# Patient Record
Sex: Female | Born: 1970 | Race: Black or African American | Hispanic: No | Marital: Married | Smoking: Current every day smoker
Health system: Southern US, Community
[De-identification: ages and names within clinical notes are randomized; demographics above are authoritative.]

## PROBLEM LIST (undated history)

## (undated) DIAGNOSIS — M5030 Other cervical disc degeneration, unspecified cervical region: Secondary | ICD-10-CM

## (undated) DIAGNOSIS — G47419 Narcolepsy without cataplexy: Secondary | ICD-10-CM

## (undated) DIAGNOSIS — M5430 Sciatica, unspecified side: Secondary | ICD-10-CM

## (undated) DIAGNOSIS — M549 Dorsalgia, unspecified: Secondary | ICD-10-CM

## (undated) DIAGNOSIS — G8929 Other chronic pain: Secondary | ICD-10-CM

## (undated) DIAGNOSIS — N83209 Unspecified ovarian cyst, unspecified side: Secondary | ICD-10-CM

## (undated) HISTORY — PX: OTHER SURGICAL HISTORY: SHX169

## (undated) HISTORY — PX: KNEE SURGERY: SHX244

## (undated) HISTORY — PX: ABDOMINAL HYSTERECTOMY: SHX81

---

## 1998-12-10 ENCOUNTER — Ambulatory Visit: Admission: RE | Admit: 1998-12-10 | Discharge: 1998-12-10 | Payer: Self-pay

## 1998-12-18 ENCOUNTER — Ambulatory Visit: Admission: RE | Admit: 1998-12-18 | Discharge: 1998-12-18 | Payer: Self-pay

## 2000-07-26 ENCOUNTER — Inpatient Hospital Stay (HOSPITAL_COMMUNITY): Admission: EM | Admit: 2000-07-26 | Discharge: 2000-07-28 | Payer: Self-pay | Admitting: *Deleted

## 2001-03-12 ENCOUNTER — Emergency Department (HOSPITAL_COMMUNITY): Admission: EM | Admit: 2001-03-12 | Discharge: 2001-03-12 | Payer: Self-pay | Admitting: Emergency Medicine

## 2001-03-21 ENCOUNTER — Encounter: Payer: Self-pay | Admitting: Internal Medicine

## 2001-03-21 ENCOUNTER — Ambulatory Visit (HOSPITAL_COMMUNITY): Admission: RE | Admit: 2001-03-21 | Discharge: 2001-03-21 | Payer: Self-pay | Admitting: Internal Medicine

## 2001-08-14 ENCOUNTER — Other Ambulatory Visit: Admission: RE | Admit: 2001-08-14 | Discharge: 2001-08-14 | Payer: Self-pay | Admitting: Obstetrics and Gynecology

## 2001-11-25 ENCOUNTER — Emergency Department (HOSPITAL_COMMUNITY): Admission: EM | Admit: 2001-11-25 | Discharge: 2001-11-25 | Payer: Self-pay | Admitting: *Deleted

## 2001-12-14 ENCOUNTER — Emergency Department (HOSPITAL_COMMUNITY): Admission: EM | Admit: 2001-12-14 | Discharge: 2001-12-14 | Payer: Self-pay | Admitting: Emergency Medicine

## 2001-12-14 ENCOUNTER — Encounter: Payer: Self-pay | Admitting: Emergency Medicine

## 2003-08-16 ENCOUNTER — Emergency Department (HOSPITAL_COMMUNITY): Admission: EM | Admit: 2003-08-16 | Discharge: 2003-08-16 | Payer: Self-pay | Admitting: Emergency Medicine

## 2003-08-20 ENCOUNTER — Inpatient Hospital Stay (HOSPITAL_COMMUNITY): Admission: RE | Admit: 2003-08-20 | Discharge: 2003-08-22 | Payer: Self-pay | Admitting: Obstetrics & Gynecology

## 2004-11-15 ENCOUNTER — Emergency Department (HOSPITAL_COMMUNITY): Admission: EM | Admit: 2004-11-15 | Discharge: 2004-11-15 | Payer: Self-pay | Admitting: Emergency Medicine

## 2004-11-19 ENCOUNTER — Ambulatory Visit: Payer: Self-pay | Admitting: Orthopedic Surgery

## 2004-11-24 ENCOUNTER — Ambulatory Visit (HOSPITAL_COMMUNITY): Admission: RE | Admit: 2004-11-24 | Discharge: 2004-11-24 | Payer: Self-pay | Admitting: Orthopedic Surgery

## 2004-11-30 ENCOUNTER — Ambulatory Visit: Payer: Self-pay | Admitting: Orthopedic Surgery

## 2004-12-08 ENCOUNTER — Ambulatory Visit (HOSPITAL_COMMUNITY): Admission: RE | Admit: 2004-12-08 | Discharge: 2004-12-08 | Payer: Self-pay | Admitting: Orthopedic Surgery

## 2004-12-08 ENCOUNTER — Ambulatory Visit: Payer: Self-pay | Admitting: Orthopedic Surgery

## 2004-12-10 ENCOUNTER — Ambulatory Visit: Payer: Self-pay | Admitting: Orthopedic Surgery

## 2004-12-10 ENCOUNTER — Encounter (HOSPITAL_COMMUNITY): Admission: RE | Admit: 2004-12-10 | Discharge: 2005-01-09 | Payer: Self-pay | Admitting: Orthopedic Surgery

## 2005-01-18 ENCOUNTER — Encounter (HOSPITAL_COMMUNITY): Admission: RE | Admit: 2005-01-18 | Discharge: 2005-02-17 | Payer: Self-pay | Admitting: Orthopedic Surgery

## 2005-01-20 ENCOUNTER — Ambulatory Visit: Payer: Self-pay | Admitting: Orthopedic Surgery

## 2005-02-22 ENCOUNTER — Ambulatory Visit: Payer: Self-pay | Admitting: Orthopedic Surgery

## 2006-04-08 ENCOUNTER — Emergency Department (HOSPITAL_COMMUNITY): Admission: EM | Admit: 2006-04-08 | Discharge: 2006-04-08 | Payer: Self-pay | Admitting: Emergency Medicine

## 2006-07-28 ENCOUNTER — Emergency Department (HOSPITAL_COMMUNITY): Admission: EM | Admit: 2006-07-28 | Discharge: 2006-07-28 | Payer: Self-pay | Admitting: Emergency Medicine

## 2006-08-01 ENCOUNTER — Ambulatory Visit: Payer: Self-pay | Admitting: Orthopedic Surgery

## 2006-08-29 ENCOUNTER — Ambulatory Visit: Payer: Self-pay | Admitting: Orthopedic Surgery

## 2007-02-12 ENCOUNTER — Emergency Department (HOSPITAL_COMMUNITY): Admission: EM | Admit: 2007-02-12 | Discharge: 2007-02-12 | Payer: Self-pay | Admitting: Emergency Medicine

## 2007-05-26 ENCOUNTER — Emergency Department (HOSPITAL_COMMUNITY): Admission: EM | Admit: 2007-05-26 | Discharge: 2007-05-26 | Payer: Self-pay | Admitting: Emergency Medicine

## 2008-01-10 ENCOUNTER — Emergency Department (HOSPITAL_COMMUNITY): Admission: EM | Admit: 2008-01-10 | Discharge: 2008-01-10 | Payer: Self-pay | Admitting: Emergency Medicine

## 2008-03-10 ENCOUNTER — Emergency Department (HOSPITAL_COMMUNITY): Admission: EM | Admit: 2008-03-10 | Discharge: 2008-03-10 | Payer: Self-pay | Admitting: Emergency Medicine

## 2011-02-19 NOTE — Op Note (Signed)
NAME:  Mary Holland, Mary Holland                       ACCOUNT NO.:  [id number]   MEDICAL RECORD NO.:  [id number]                   PATIENT TYPE:  AMB   LOCATION:  DAY                                  FACILITY:  [facility]   PHYSICIAN:  Lazaro Arms, M.D.                DATE OF BIRTH:  [birth year]   DATE OF PROCEDURE:  08/20/2003  DATE OF DISCHARGE:                                 OPERATIVE REPORT   PREOPERATIVE DIAGNOSES:  1. Dysmenorrhea.  2. Menometrorrhagia.  3. Left lower quadrant pain.  4. Dyspareunia.   POSTOPERATIVE DIAGNOSES:  1. Dysmenorrhea.  2. Menometrorrhagia.  3. Left lower quadrant pain.  4. Dyspareunia.   PROCEDURE:  1. Total abdominal hysterectomy.  2. Bilateral salpingo-oophorectomy.   SURGEON:  Lazaro Arms, M.D.   ANESTHESIA:  General endotracheal anesthesia.   FINDINGS:  The patient had normal size uterus.  Left ovary had a hemorrhagic  cyst on it.  The right ovary was normal.   DESCRIPTION OF OPERATION:  The patient was taken to the operating room and  placed in the supine position where she underwent general endotracheal  anesthesia. After being placed in the supine position, she was then prepped  and draped in the usual sterile fashion.  The vagina was prepped.  A  Pfannenstiel incision was made and carried down sharply to the rectus fascia  which was scored in the midline and extended laterally.  The fascia was  taken off the muscles superiorly and inferiorly without difficulty.  The  muscles were divided and the peritoneal cavity was entered.  Balfour self-  retaining retractor was placed.  The bladder blade was placed.   The uterine cornu were grasped.  The found ligament was sutured ligated and  cut.  The vesicouterine serosal flap on the left on the left was created.  The infundibulopelvic ligament on the left was isolated.  It was clamped,  cut and double suture ligated.  The uterine vessels were skeletonized.  The  bladder was pushed off the lower  uterine segment.  The uterine vessels were  clamped, cut, and suture ligated.  The right round ligament was suture  ligated and cut.  The utero-ovarian ligament was cross-clamped, sparing the  ovary.  It was double suture ligated.  The uterine vessels were  skeletonized.  The vesicouterine serosal flap was created.  The uterine  vessels were clamped, cut, and suture ligated.  Serial pedicles were taken  down the cervix to the cardinal ligament; each pedicle being clamped, cut,  and sutured ligated.  The vagina was then cross clamped.  The specimen was  removed; and the vaginal was closed with vaginal angle sutures, interrupted  figure-of-eight suture.   The pelvis was irrigated vigorously.  There was good hemostasis of all  pedicles.  The muscles were reapproximated loosely.  The fascia closed using  #0 Vicryl.  The subcutaneous tissue was made hemostatic and irrigated.  The  skin was closed using skin staples.  The patient tolerated the procedure  well.  She experienced 250 cc of blood loss; was taken to the recovery room  in good stable condition.  All counts were correct.      ___________________________________________                                            Lazaro Arms, M.D.   LHE/MEDQ  D:  08/20/2003  T:  08/20/2003  Job:  [id number]

## 2011-02-19 NOTE — Discharge Summary (Signed)
[facility]  Patient:    Mary Holland, Mary Holland                      MRN: [id number] Adm. Date:  [id number] Disc. Date: [id number] Attending:  Denny Peon                           Discharge Summary  INTRODUCTION:  Mary Holland is a 40 year old black female who was admitted from [facility] after overdosing on Xanax.  The patient was depressed due to problems at home.  She took Xanax 15 tablets with thought to hurt herself, go to sleep, and never wake up.  She does not have history of previous hospitalization.  Initial diagnosis was major depression, single episode, and status post suicide attempt.  Upon admission, the patient did not claim any major medical problems and physical examination was normal.  HOSPITAL COURSE:  The patient was placed on special observation.  Celexa was introduced in dose of 20 mg in the morning and family session was scheduled. The patient was doing better the next day after admission, slept better, no longer having suicidal thoughts.  There was a meeting with the patients husband and the patients brother and both family members recognized that the patient needs more support and more help.  She was anxious to go home and felt that she will be safe and we concur with this assessment.  On October 25 she seemed to be much improved, complaining of some side effects of the medication in the form of a headache so Celexa was changed to 10 mg twice a day hoping that we will alleviate possible side effects.  Mental status did not show dangerousness or psychosis, improved insight, and verbal commitment to sobriety.  MEDICAL PROBLEMS:  The patient did not have any medical problems during this brief hospitalization.  Most blood work was done at [facility] and CBC was normal.  Kidney function tests: Normal.  Urinalysis: Normal.  Drug screen was positive for marijuana metabolites and the patient admitted  smoking marijuana.  Vital signs during this hospitalization were stable with blood pressure 130/76, normal pulse, temperature, and respiratory rate.  DISCHARGE DIAGNOSES: Axis I:    1. Major depression, single episode.            2. Status post suicidal attempt.            3. Cannabis abuse. Axis II:   Deferred. Axis III:  No diagnosis. Axis IV:   Psychosocial stressors: Moderate to severe; medical problems and            financial problems. Axis V:    Global assessment of functioning on admission was 30, past year            70, upon discharge 55.  DISCHARGE MEDICATIONS: Celexa 20 mg tablet to take half tablet twice a day.  DISCHARGE RECOMMENDATIONS:  She could return to work on August 01, 2000.  She is not supposed to drink or smoke marijuana.  She was informed that if any deterioration occurs, she should come to the emergency room.  FOLLOWUP:  [facility] on November 5 at 8 a.m. and she is supposed to attend drug and alcohol program at the same setting DD:  08/16/00 TD:  08/16/00 Job: [id number]

## 2011-02-19 NOTE — Op Note (Signed)
NAMEALLI, Mary Holland             ACCOUNT NO.:  [id number]   MEDICAL RECORD NO.:  [id number]          PATIENT TYPE:  AMB   LOCATION:  DAY                           FACILITY:  [facility]   PHYSICIAN:  Vickki Hearing, M.D.DATE OF BIRTH:  02/15/1971   DATE OF PROCEDURE:  12/08/2004  DATE OF DISCHARGE:                                 OPERATIVE REPORT   PREOPERATIVE DIAGNOSIS:  OCD, right knee lateral femoral condyle.   POSTOPERATIVE DIAGNOSIS:  OCD, right knee lateral femoral condyle.   PROCEDURE:  Arthroscopy, right knee, microfracture lateral femoral condyle.   SURGEON:  Dr. Romeo Apple.   ANESTHETIC:  General.   OPERATIVE FINDINGS:  Large 15 x 10 mm lateral femoral condyle delamination  of the cartilage articular surface.   HISTORY AND INDICATIONS:  A 40 year old female presented with knee effusion  and pain. Physical findings of medial joint line tenderness. MRI showed OCD,  lateral femoral condyle. The patient was brought into the hospital for  arthroscopy, primarily to evaluate the OCD lesion and the cause of the  fusion.   DETAILS OF PROCEDURE:  The patient identified as Mary Holland. History  and physical was updated. The surgical site was countersigned by the surgeon  after being marked by the patient. Ancef was given. She was taken to the  operating room. General anesthesia was administered. Sterile prep and drape  was performed, and the time-out was taken as required, and it was completed  as required, confirming the antibiotics had been given with an hour, all  equipment was present, proper patient and proper extremity were prepped for  surgery.   Two incision arthroscopy was performed. Diagnostic arthroscopy was done.  Findings are stated above. The lesion on the lateral femoral condyle was  debrided with a shaver and an ArthroCare wand and then 6 microfracture  subchondral penetrations were performed. The loose cartilaginous tissue was  debrided. Wound was  irrigated. Knee was irrigated. The  scope was removed.  Portals were closed with Steri-Strips. Knee was injected with 30 cc of  Marcaine. Sterile bandages were applied along with a CryoCuff. She was  extubated and taken to recovery room in stable condition    SEH/MEDQ  D:  12/08/2004  T:  12/08/2004  Job:  [id number]

## 2011-02-19 NOTE — H&P (Signed)
NAME:  Mary Holland, Mary Holland                       ACCOUNT NO.:  [id number]   MEDICAL RECORD NO.:  [id number]                   PATIENT TYPE:  EMS   LOCATION:  ED                                   FACILITY:  [facility]   PHYSICIAN:  Lazaro Arms, M.D.                DATE OF BIRTH:  August 10, 1971   DATE OF ADMISSION:  08/16/2003  DATE OF DISCHARGE:  08/16/2003                                HISTORY & PHYSICAL   HISTORY OF PRESENT ILLNESS:  The patient is a 40 year old African-American  female, gravida 2, para 2, status post a tubal ligation, who is admitted for  a TAH and LSO.  The patient has chronic problems with heavy, painful  periods, and recently, she has been suffering with left lower quadrant pain.  She had a sonogram performed in September, which was essentially normal  except for some small fibroids of the uterus.  She was placed on Lo-Ovral  birth control pills.  She returned on 07/31/2003, still in significant left  lower quadrant pain and pain with intercourse, and she basically bled  continuously on the birth control pills.  Because of the patient's continued  heavy bleeding and her left lower quadrant pain, she is admitted for TAH,  LSO.   PAST MEDICAL HISTORY:  Narcolepsy.   PAST SURGICAL HISTORY:  Two C-sections and a tubal ligation.   PAST OB:  As above.   CURRENT MEDICATIONS:  Just her Lortab for pain.   REVIEW OF SYSTEMS:  Otherwise, negative.   SOCIAL HISTORY:  She does smoke, and she works full-time.   PHYSICAL EXAMINATION:  VITAL SIGNS:  Her blood pressure is 120/80.  Her  weight is 130 pounds.  HEENT:  Unremarkable.  NECK:  Thyroid is normal.  LUNGS:  Clear.  HEART:  Regular rate and rhythm without regurgitant murmurs, rubs or gallop.  BREASTS:  Without masses, discharge, or skin change.  ABDOMEN:  Benign except tenderness in the left lower quadrant.  PELVIC:  Reveals a slightly enlarged uterus, tender to palpation.  The left  ovary is tender to palpation.   The right adnexa is negative.  EXTREMITIES:  Warm with no edema.  NEUROLOGICAL:  Grossly intact.   IMPRESSION:  1. Persistent left lower quadrant pain.  2. Menometrorrhagia and continued dysmenorrhea.  3. Dyspareunia.   PLAN:  The patient is admitted for a TAH, LSO.  She understands that the  right ovary is abnormal.  That will be removed as well.  She understands the  risks, benefits, indications, alternatives and will proceed.     ___________________________________________                                         Lazaro Arms, M.D.   Loraine Maple  D:  08/19/2003  T:  08/19/2003  Job:  [id number]

## 2011-02-19 NOTE — H&P (Signed)
[facility]  Patient:    Mary Holland, [name]                      MRN: [id number] Adm. Date:  07/26/00 Disc. Date: 07/28/00 Attending:  Netta Cedars, M.D. Dictator:   Landry Corporal, NP                   Psychiatric Admission Assessment  PATIENT IDENTIFICATION:  A 40 year old black female voluntarily admitted from a transfer from [facility] for overdose on Xanax.  HISTORY OF PRESENT ILLNESS:  Patient reports having marital problems.  She reports that her spouse was threatening to leave.  She states he has been very jealous and suspicious of her being unfaithful, especially at her second job at [employer].  She states she took 15 Xanax 0.2 mg to hurt herself.  She has a history of decreased sleep.  No problems with appetite.  She reports she has had some depression since her fathers death last year from a nursing home, and she states she is now just starting to realize it.  No auditory or visual hallucinations.  Negative paranoia.  PAST PSYCHIATRIC HISTORY:  She was put on Xanax 0.2 mg about a month ago from Dr. Felecia Shelling for anxiety.  She has had no prior psychiatric hospitalizations.  SUBSTANCE ABUSE HISTORY:  She smokes one pack a day for the past 14 years. Denies any alcohol use.  She says she smokes marijuana on occasion.  PAST MEDICAL HISTORY:  Her primary care Bensyn Bornemann is Dr. Felecia Shelling in [place]. Medical problems:  She states she has narcolepsy diagnosed two years ago, unsure of the physician who diagnosed her but she states he was at [facility].  Medications:  She is on Ritalin 5 mg every day, which she says is effective.  DRUG ALLERGIES:  No known allergies.  PHYSICAL EXAMINATION:  Performed at [facility].  Her urine drug screen showed positive "benzos" and positive THC.  SOCIAL HISTORY:  A 40 year old married black female for 16 months.  She has two children, ages 72 and 11.  She lives with her husband and children.  Present husband  is not the father of her two children, but she says he is a good stepfather.  She has two jobs.  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  She has a 12th grade education.  She has some financial problems. She says she is behind on her rent.  Her husband is trying to get disability for a back injury.  FAMILY HISTORY:  No problems that she is aware of.  MENTAL STATUS EXAMINATION:  She is a 40 year old young black female, casually dressed, cooperative, pleasant, good eye contact.  Speech is normal and relevant.  Mood is depressed.  Affect is full range.  Thought processes are intact.  No evidence of psychosis.  Negative auditory or visual hallucinations.  Negative delusions.  Negative flight of ideas.  Cognitive: Oriented x 3.  Her memory is good.  She has average intelligence.  Her judgment is poor, insight is fair.  Poor impulse control.  ADMISSION DIAGNOSES: Axis I:    1. Major depression, single episode.            2. Suicide attempt. Axis II:   Deferred. Axis III:  None. Axis IV:   Severe, with problems related to primary support group. Axis V:  Current is 30, past year 45.  INITIAL PLAN OF CARE:  Voluntary admission to [facility] for suicide attempt and depression.  Contract for safety, check every 15 minutes.  Patient is in agreement.  We will order a family session for marital issues and initiate Celexa 20 mg p.o. q.a.m.  The family session will be also needed for discharge planning.  ESTIMATED LENGTH OF STAY:  Three days. DD:  07/26/00 TD:  07/26/00 Job: [id number]

## 2011-02-19 NOTE — H&P (Signed)
Mary Holland, Mary Holland             ACCOUNT NO.:  [id number]   MEDICAL RECORD NO.:  [id number]          PATIENT TYPE:  AMB   LOCATION:  DAY                           FACILITY:  [facility]   PHYSICIAN:  Vickki Hearing, M.D.DATE OF BIRTH:  09-Sep-1971   DATE OF ADMISSION:  DATE OF DISCHARGE:  [facility]                                HISTORY & PHYSICAL   CHIEF COMPLAINT:  This is a 40 year old female with a chief complaint of  right knee pain and swelling with no trauma.   HISTORY:  This 40 year old female presented with spontaneous onset of  swelling of the right knee.  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  She denies any trauma or previous history  of knee problems.  She was sent for an MRI.  Thought to have meniscal tear.  MRI showed a large weightbearing surface lateral femoral chondral lesion and  a recommendation has been made for microfracture versus OATS procedure.  The  patient understands the risks and benefits and postoperative plan and agreed  to proceed with surgery.  Gave informed consent.   REVIEW OF SYSTEMS:  Normal.   ALLERGIES:  She has no known allergies.   PAST SURGICAL HISTORY:  She has had three procedures:  1.  Cesarean sections in 1994 and 1995.  2.  Hysterectomy in 2004.   MEDICATIONS:  None.   FAMILY HISTORY:  Cancer, asthma and diabetes.   SOCIAL HISTORY:  Smokes one packs of cigarettes per day.  Drinks on  occasion.  Caffeine use a lot.  Last grade completed was 12.   PHYSICAL EXAMINATION:  WEIGHT:  175 pounds.  VITAL SIGNS:  Pulse 70, respiratory rate 18.  HEENT:  Normal.  NECK:  Supple.  CHEST:  Clear.  HEART:  Rate and rhythm normal.  ABDOMEN:  Soft.  CARDIOVASCULAR:  Normal function with no swelling and normal pulses.  LYMPH NODES:  Negative.  EXTREMITIES:  The knee exam shows decreased range of motion.  Painful range  of motion throughout the arc 0-40.  Tenderness on the medial compartment and  joint line.   The lateral compartments appear to be nontender.  SKIN:  Intact.  NEUROLOGIC:  She was awake and alert.  Sensation was normal.   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   RECOMMENDATIONS:  Arthroscopy and possible microfracture versus cartilage  autologous graft using the OATS procedure, right knee.      SEH/MEDQ  D:  12/07/2004  T:  12/07/2004  Job:  [id number]

## 2011-04-21 ENCOUNTER — Other Ambulatory Visit: Payer: Self-pay

## 2011-04-21 ENCOUNTER — Encounter: Payer: Self-pay | Admitting: Emergency Medicine

## 2011-04-21 ENCOUNTER — Emergency Department (HOSPITAL_COMMUNITY): Payer: Self-pay

## 2011-04-21 ENCOUNTER — Emergency Department (HOSPITAL_COMMUNITY)
Admission: EM | Admit: 2011-04-21 | Discharge: 2011-04-21 | Disposition: A | Discharge status: Home / Self Care | Payer: Self-pay | Attending: Emergency Medicine | Admitting: Emergency Medicine

## 2011-04-21 DIAGNOSIS — R55 Syncope and collapse: Secondary | ICD-10-CM | POA: Insufficient documentation

## 2011-04-21 DIAGNOSIS — R42 Dizziness and giddiness: Secondary | ICD-10-CM | POA: Insufficient documentation

## 2011-04-21 DIAGNOSIS — R0602 Shortness of breath: Secondary | ICD-10-CM | POA: Insufficient documentation

## 2011-04-21 DIAGNOSIS — R079 Chest pain, unspecified: Secondary | ICD-10-CM | POA: Insufficient documentation

## 2011-04-21 LAB — BASIC METABOLIC PANEL
BUN: 10 mg/dL (ref 6–23)
CO2: 22 mEq/L (ref 19–32)
Calcium: 9.3 mg/dL (ref 8.4–10.5)
Chloride: 103 mEq/L (ref 96–112)
Creatinine, Ser: 0.66 mg/dL (ref 0.50–1.10)
GFR calc Af Amer: >60 mL/min (ref >60)
GFR calc non Af Amer: >60 mL/min (ref >60)
Glucose, Bld: 120 mg/dL — ABNORMAL HIGH (ref 70–99)
Potassium: 3.6 mEq/L (ref 3.5–5.1)
Sodium: 136 mEq/L (ref 135–145)

## 2011-04-21 LAB — DIFFERENTIAL
Basophils Absolute: 0 10*3/uL (ref 0.0–0.1)
Basophils Relative: 0 % (ref 0–1)
Eosinophils Absolute: 0.1 10*3/uL (ref 0.0–0.7)
Eosinophils Relative: 1 % (ref 0–5)
Lymphocytes Relative: 33 % (ref 12–46)
Lymphs Abs: 3.4 10*3/uL (ref 0.7–4.0)
Monocytes Absolute: 0.6 10*3/uL (ref 0.1–1.0)
Monocytes Relative: 6 % (ref 3–12)
Neutro Abs: 6.2 10*3/uL (ref 1.7–7.7)
Neutrophils Relative %: 60 % (ref 43–77)

## 2011-04-21 LAB — CBC
HCT: 39 % (ref 36.0–46.0)
Hemoglobin: 13.3 g/dL (ref 12.0–15.0)
MCH: 30.2 pg (ref 26.0–34.0)
MCHC: 34.1 g/dL (ref 30.0–36.0)
MCV: 88.6 fL (ref 78.0–100.0)
Platelets: 216 10*3/uL (ref 150–400)
RBC: 4.4 MIL/uL (ref 3.87–5.11)
RDW: 13.8 % (ref 11.5–15.5)
WBC: 10.4 10*3/uL (ref 4.0–10.5)

## 2011-04-21 LAB — D-DIMER, QUANTITATIVE: D-Dimer, Quant: 0.32 ug/mL-FEU (ref 0.00–0.48)

## 2011-04-21 LAB — CARDIAC PANEL(CRET KIN+CKTOT+MB+TROPI)
CK, MB: 1.8 ng/mL (ref 0.3–4.0)
Relative Index: INVALID (ref 0.0–2.5)
Total CK: 74 U/L (ref 7–177)
Troponin I: <0.3 ng/mL (ref <0.30)

## 2011-04-21 NOTE — ED Provider Notes (Signed)
History     Chief Complaint  Patient presents with  . Chest Pain   Patient is a 40 y.o. female presenting with chest pain. The history is provided by the patient.  Chest Pain The chest pain began less than 1 hour ago. Duration of episode(s) is 10 minutes. Chest pain occurs constantly. The chest pain is resolved. The pain is associated with breathing. The severity of the pain is moderate. The quality of the pain is described as sharp. The pain does not radiate. Chest pain is worsened by deep breathing. Primary symptoms include syncope, shortness of breath and dizziness. Pertinent negatives for primary symptoms include no palpitations, no abdominal pain and no vomiting.  Before the onset of the syncopal episode there was dizziness. The syncopal episode occurred with shortness of breath. The syncopal episode did not occur with palpitations.  Dizziness does not occur with vomiting.  Risk factors include no known risk factors.    Pt reports she was at work.  She felt sharp CP for about 10 minutes, then had brief syncopal event.  Now feels improved.  No injury reported from fall.  The syncopal event was brief and she regained consciousness soon after.  No CP at this time No recent travel/surgery.  No h/o CAD/PE. No drug use   History reviewed. No pertinent past medical history.  Past Surgical History  Procedure Date  . Cesarean section   . Knee surgery     No family history on file.  History  Substance Use Topics  . Smoking status: Current Everyday Smoker  . Smokeless tobacco: Not on file  . Alcohol Use: Yes    OB History    Grav Para Term Preterm Abortions TAB SAB Ect Mult Living                  Review of Systems  Respiratory: Positive for shortness of breath.   Cardiovascular: Positive for chest pain and syncope. Negative for palpitations.  Gastrointestinal: Negative for vomiting and abdominal pain.  Neurological: Positive for dizziness.  All other systems reviewed and  are negative.    Physical Exam  BP 133/72  Pulse 80  Temp(Src) 98.3 F (36.8 C) (Oral)  Resp 18  Ht 5\' 4"  (1.626 m)  Wt 190 lb (86.183 kg)  BMI 32.61 kg/m2  SpO2 100%  Physical Exam  CONSTITUTIONAL: Well developed/well nourished HEAD AND FACE: Normocephalic/atraumatic EYES: EOMI/PERRL ENMT: Mucous membranes moist NECK: supple no meningeal signs SPINE:entire spine nontender CV: S1/S2 noted, no murmurs/rubs/gallops noted LUNGS: Lungs are clear to auscultation bilaterally, no apparent distress ABDOMEN: soft, nontender, no rebound or guarding \\NEURO : Pt is awake/alert, moves all extremitiesx4 EXTREMITIES: pulses normal, full ROM, no edema SKIN: warm, color normal PSYCH: no abnormalities of mood noted  ED Course  Procedures  MDM Nursing notes reviewed and considered in documentation xrays reviewed and considered Previous records reviewed and considered  Pt well appearing, had CP then syncope.  Will check ddimer to eval for PE.  Pt is pain free at this time Suspicion for ACS is low at this time S/p hysterectomy, but no recent surgery  ED ECG REPORT   Date: 04/21/2011   Rate: 80  Rhythm: normal sinus rhythm  QRS Axis: normal  Intervals: normal  ST/T Wave abnormalities: nonspecific ST changes  Conduction Disutrbances:none  Narrative Interpretation:   Old EKG Reviewed: none available at time of initial interpretation     I repeated EKG, and no significant change.  An old ekg from 2009 was  found, no significant change, QT is shorter on today's ekg  Pt improved, no distress.  Admit she had syncope a few yrs ago, but has never followed up.  I advised close f/u with her PCP and may need outpatient cardiology eval.  She was low risk for PE and ddimer negative.  Story not c/w ACS.   Stable for d/c  Joya Gaskins, MD 04/21/11 [phone]

## 2011-04-21 NOTE — ED Notes (Signed)
Pt resting in bed, stated feeling much better, repeat ekg obtained, othostatic vs obtained.  Repositioned in bed.

## 2011-07-01 LAB — BASIC METABOLIC PANEL
BUN: 9
CO2: 26
Calcium: 9.1
Chloride: 103
Creatinine, Ser: 1.09
GFR calc Af Amer: >60
GFR calc non Af Amer: 57 — ABNORMAL LOW
Glucose, Bld: 173 — ABNORMAL HIGH
Potassium: 3.5
Sodium: 136

## 2011-07-01 LAB — URINE MICROSCOPIC-ADD ON

## 2011-07-01 LAB — URINALYSIS, ROUTINE W REFLEX MICROSCOPIC
Glucose, UA: NEGATIVE
Ketones, ur: NEGATIVE
Nitrite: NEGATIVE
Protein, ur: 30 — AB
Specific Gravity, Urine: >1.03 — ABNORMAL HIGH
Urobilinogen, UA: 0.2
pH: 5.5

## 2011-07-01 LAB — DIFFERENTIAL
Basophils Absolute: 0
Basophils Relative: 0
Eosinophils Absolute: 0.1
Eosinophils Relative: 1
Lymphocytes Relative: 15
Lymphs Abs: 2.4
Monocytes Absolute: 0.9
Monocytes Relative: 6
Neutro Abs: 12.3 — ABNORMAL HIGH
Neutrophils Relative %: 78 — ABNORMAL HIGH

## 2011-07-01 LAB — CBC
HCT: 42.5
Hemoglobin: 14.9
MCHC: 35
MCV: 88.8
Platelets: 206
RBC: 4.79
RDW: 13.6
WBC: 15.7 — ABNORMAL HIGH

## 2011-07-01 LAB — PREGNANCY, URINE: Preg Test, Ur: NEGATIVE

## 2011-07-16 LAB — RAPID STREP SCREEN (MED CTR MEBANE ONLY): Streptococcus, Group A Screen (Direct): NEGATIVE

## 2012-04-02 ENCOUNTER — Emergency Department (HOSPITAL_COMMUNITY): Payer: Self-pay

## 2012-04-02 ENCOUNTER — Encounter (HOSPITAL_COMMUNITY): Payer: Self-pay | Admitting: Emergency Medicine

## 2012-04-02 ENCOUNTER — Emergency Department (HOSPITAL_COMMUNITY)
Admission: EM | Admit: 2012-04-02 | Discharge: 2012-04-02 | Disposition: A | Discharge status: Home / Self Care | Payer: Self-pay | Attending: Emergency Medicine | Admitting: Emergency Medicine

## 2012-04-02 DIAGNOSIS — N83209 Unspecified ovarian cyst, unspecified side: Secondary | ICD-10-CM | POA: Insufficient documentation

## 2012-04-02 DIAGNOSIS — A599 Trichomoniasis, unspecified: Secondary | ICD-10-CM | POA: Insufficient documentation

## 2012-04-02 DIAGNOSIS — B9689 Other specified bacterial agents as the cause of diseases classified elsewhere: Secondary | ICD-10-CM | POA: Insufficient documentation

## 2012-04-02 DIAGNOSIS — A499 Bacterial infection, unspecified: Secondary | ICD-10-CM | POA: Insufficient documentation

## 2012-04-02 DIAGNOSIS — N760 Acute vaginitis: Secondary | ICD-10-CM | POA: Insufficient documentation

## 2012-04-02 DIAGNOSIS — R109 Unspecified abdominal pain: Secondary | ICD-10-CM | POA: Insufficient documentation

## 2012-04-02 HISTORY — DX: Unspecified ovarian cyst, unspecified side: N83.209

## 2012-04-02 LAB — URINALYSIS, ROUTINE W REFLEX MICROSCOPIC
Glucose, UA: NEGATIVE mg/dL
Hgb urine dipstick: NEGATIVE
Specific Gravity, Urine: 1.019 (ref 1.005–1.030)

## 2012-04-02 LAB — CBC WITH DIFFERENTIAL/PLATELET
Basophils Absolute: 0 10*3/uL (ref 0.0–0.1)
Basophils Relative: 0 % (ref 0–1)
HCT: 40.4 % (ref 36.0–46.0)
Hemoglobin: 13.5 g/dL (ref 12.0–15.0)
Lymphocytes Relative: 32 % (ref 12–46)
Monocytes Absolute: 0.7 10*3/uL (ref 0.1–1.0)
Monocytes Relative: 5 % (ref 3–12)
Neutro Abs: 7.5 10*3/uL (ref 1.7–7.7)
Neutrophils Relative %: 61 % (ref 43–77)
RDW: 14 % (ref 11.5–15.5)
WBC: 12.3 10*3/uL — ABNORMAL HIGH (ref 4.0–10.5)

## 2012-04-02 LAB — BASIC METABOLIC PANEL
CO2: 22 mEq/L (ref 19–32)
Chloride: 103 mEq/L (ref 96–112)
Creatinine, Ser: 0.79 mg/dL (ref 0.50–1.10)
GFR calc Af Amer: >90 mL/min (ref >90)
Potassium: 3.9 mEq/L (ref 3.5–5.1)

## 2012-04-02 LAB — POCT PREGNANCY, URINE: Preg Test, Ur: NEGATIVE

## 2012-04-02 LAB — URINE MICROSCOPIC-ADD ON

## 2012-04-02 LAB — WET PREP, GENITAL: Trich, Wet Prep: NONE SEEN

## 2012-04-02 LAB — PREGNANCY, URINE: Preg Test, Ur: NEGATIVE

## 2012-04-02 MED ORDER — HYDROMORPHONE HCL PF 1 MG/ML IJ SOLN
INTRAMUSCULAR | Status: AC
  Filled 2012-04-02: qty 1

## 2012-04-02 MED ORDER — IBUPROFEN 800 MG PO TABS: 800.0000 mg | ORAL_TABLET | Freq: Three times a day (TID) | ORAL | Status: AC

## 2012-04-02 MED ORDER — MORPHINE SULFATE 4 MG/ML IJ SOLN
4.0000 mg | Freq: Once | INTRAMUSCULAR | Status: AC
  Administered 2012-04-02: 4 mg via INTRAVENOUS
  Filled 2012-04-02: qty 1

## 2012-04-02 MED ORDER — IOHEXOL 300 MG/ML  SOLN
20.0000 mL | INTRAMUSCULAR | Status: AC
  Administered 2012-04-02 (×2): 20 mL via ORAL

## 2012-04-02 MED ORDER — METRONIDAZOLE 500 MG PO TABS
2000.0000 mg | ORAL_TABLET | Freq: Once | ORAL | Status: AC
  Administered 2012-04-02: 2000 mg via ORAL
  Filled 2012-04-02: qty 4

## 2012-04-02 MED ORDER — HYDROMORPHONE HCL PF 1 MG/ML IJ SOLN
1.0000 mg | Freq: Once | INTRAMUSCULAR | Status: AC
  Administered 2012-04-02: 1 mg via INTRAVENOUS

## 2012-04-02 MED ORDER — ONDANSETRON HCL 4 MG/2ML IJ SOLN
4.0000 mg | Freq: Once | INTRAMUSCULAR | Status: AC
  Administered 2012-04-02: 4 mg via INTRAVENOUS
  Filled 2012-04-02: qty 2

## 2012-04-02 MED ORDER — HYDROMORPHONE HCL PF 1 MG/ML IJ SOLN: 1.0000 mg | Freq: Once | INTRAMUSCULAR | Status: DC

## 2012-04-02 MED ORDER — IOHEXOL 300 MG/ML  SOLN
100.0000 mL | Freq: Once | INTRAMUSCULAR | Status: AC | PRN
  Administered 2012-04-02: 100 mL via INTRAVENOUS

## 2012-04-02 MED ORDER — ONDANSETRON HCL 4 MG PO TABS: 4.0000 mg | ORAL_TABLET | Freq: Four times a day (QID) | ORAL | Status: AC

## 2012-04-02 MED ORDER — HYDROCODONE-ACETAMINOPHEN 5-325 MG PO TABS: 2.0000 | ORAL_TABLET | ORAL | Status: AC | PRN

## 2012-04-02 MED ORDER — SODIUM CHLORIDE 0.9 % IV SOLN
INTRAVENOUS | Status: DC
  Administered 2012-04-02 (×2): via INTRAVENOUS

## 2012-04-02 NOTE — ED Notes (Signed)
Per EMS, pt begin having rt side flank pain and radiates to back and front toward groin. Polyuria x 2 days. No painful urination or blood in urine. Nausea but no vomiting. 18g LAC IV. Fentanyl IV. Vitals: 134/96, 72, 22.

## 2012-04-02 NOTE — ED Notes (Signed)
Contrast completed. CT made aware. Will continue to monitor.

## 2012-04-02 NOTE — ED Provider Notes (Signed)
History     CSN: [id number]  Arrival date & time 04/02/12  0340   First MD Initiated Contact with Patient 04/02/12 0350      Chief Complaint  Patient presents with  . Flank Pain    (Consider location/radiation/quality/duration/timing/severity/associated sxs/prior treatment) Patient is a 41 y.o. female presenting with flank pain. The history is provided by the patient.  Flank Pain Associated symptoms include abdominal pain. Pertinent negatives include no chest pain, no headaches and no shortness of breath.   the patient is a 41 year old, female, with a history of a hysterectomy.  She presents emergency department complaining of flank pain.  On the right side.  For the past 2 days.  The pain has been constant.  It is not associated with nausea, vomiting, diarrhea, or vaginal discharge.  She denies hematuria dysuria.  She has not had a cough, or shortness of breath.  She has had a hysterectomy.  For ovarian cysts, however, she does not think her appendix was removed.  She denies allergies to medications.  Level V caveat applies for severe pain and urgent need for intervention  No past medical history on file.  Past Surgical History  Procedure Date  . Cesarean section   . Knee surgery     No family history on file.  History  Substance Use Topics  . Smoking status: Current Everyday Smoker  . Smokeless tobacco: Not on file  . Alcohol Use: Yes    OB History    Grav Para Term Preterm Abortions TAB SAB Ect Mult Living                  Review of Systems  Constitutional: Negative for fever, chills and appetite change.  Respiratory: Negative for cough, chest tightness and shortness of breath.   Cardiovascular: Negative for chest pain.  Gastrointestinal: Positive for abdominal pain. Negative for nausea, vomiting and diarrhea.  Genitourinary: Positive for flank pain. Negative for dysuria, hematuria and vaginal discharge.  Musculoskeletal: Negative for back pain.  Skin: Negative  for rash.  Neurological: Negative for headaches.  Hematological: Does not bruise/bleed easily.  Psychiatric/Behavioral: Negative for confusion.  All other systems reviewed and are negative.    Allergies  Review of patient's allergies indicates no known allergies.  Home Medications   Current Outpatient Rx  Name Route Sig Dispense Refill  . ASPIRIN 325 MG PO TABS Oral Take 325 mg by mouth daily. Given by EMS     . NITROGLYCERIN 0.4 MG SL SUBL Sublingual Place 0.4 mg under the tongue every 5 (five) minutes as needed. Given by EMS       There were no vitals taken for this visit.  Physical Exam  Nursing note and vitals reviewed. Constitutional: She is oriented to person, place, and time. She appears well-developed and well-nourished. No distress.       Very uncomfortable holding right flank  HENT:  Head: Normocephalic and atraumatic.  Eyes: Conjunctivae are normal.  Neck: Normal range of motion.  Cardiovascular: Normal rate.   No murmur heard. Pulmonary/Chest: Effort normal and breath sounds normal. She has no rales.  Abdominal: Soft. Bowel sounds are normal. She exhibits no distension. There is tenderness. There is guarding. There is no rebound.       Right lower quadrant tenderness, with guarding.  No rigidity  Genitourinary:       Right flank tenderness  Musculoskeletal: Normal range of motion. She exhibits no edema.  Neurological: She is alert and oriented to person, place, and  time. No cranial nerve deficit.  Skin: Skin is warm and dry.  Psychiatric: She has a normal mood and affect. Thought content normal.    ED Course  Procedures (including critical care time) 41 year old, female, with history of hysterectomy presents with right flank pain, and tenderness for the past 2 days.  She does not have anorexia.  She has no urinary symptoms, and no vaginal discharge.  We will establish an IV and give her analgesics, and antiemetics.  I will perform a urinalysis, and blood tests,  and depending on the results may do a CAT scan with and without contrast.   Labs Reviewed  URINALYSIS, ROUTINE W REFLEX MICROSCOPIC  PREGNANCY, URINE  CBC WITH DIFFERENTIAL  BASIC METABOLIC PANEL   No results found.   No diagnosis found.    MDM  Right flank pain        Cheri Guppy, MD 04/04/12 [phone]

## 2012-04-02 NOTE — ED Notes (Signed)
Pt given contrast and informed to notify when complete.

## 2012-04-02 NOTE — ED Notes (Signed)
Report given to Koula RN  

## 2012-04-02 NOTE — Discharge Instructions (Signed)
Ovarian Cyst You need repeat imaging of your ovary in 6 weeks.  Dr. Emelda Fear should call you this week. Return to the ED if you develop new or worsening symptoms. The ovaries are small organs that are on each side of the uterus. The ovaries are the organs that produce the female hormones, estrogen and progesterone. An ovarian cyst is a sac filled with fluid that can vary in its size. It is normal for a small cyst to form in women who are in the childbearing age and who have menstrual periods. This type of cyst is called a follicle cyst that becomes an ovulation cyst (corpus luteum cyst) after it produces the women's egg. It later goes away on its own if the woman does not become pregnant. There are other kinds of ovarian cysts that may cause problems and may need to be treated. The most serious problem is a cyst with cancer. It should be noted that menopausal women who have an ovarian cyst are at a higher risk of it being a cancer cyst. They should be evaluated very quickly, thoroughly and followed closely. This is especially true in menopausal women because of the high rate of ovarian cancer in women in menopause. CAUSES AND TYPES OF OVARIAN CYSTS:  FUNCTIONAL CYST: The follicle/corpus luteum cyst is a functional cyst that occurs every month during ovulation with the menstrual cycle. They go away with the next menstrual cycle if the woman does not get pregnant. Usually, there are no symptoms with a functional cyst.   ENDOMETRIOMA CYST: This cyst develops from the lining of the uterus tissue. This cyst gets in or on the ovary. It grows every month from the bleeding during the menstrual period. It is also called a "chocolate cyst" because it becomes filled with blood that turns brown. This cyst can cause pain in the lower abdomen during intercourse and with your menstrual period.   CYSTADENOMA CYST: This cyst develops from the cells on the outside of the ovary. They usually are not cancerous. They can get  very big and cause lower abdomen pain and pain with intercourse. This type of cyst can twist on itself, cut off its blood supply and cause severe pain. It also can easily rupture and cause a lot of pain.   DERMOID CYST: This type of cyst is sometimes found in both ovaries. They are found to have different kinds of body tissue in the cyst. The tissue includes skin, teeth, hair, and/or cartilage. They usually do not have symptoms unless they get very big. Dermoid cysts are rarely cancerous.   POLYCYSTIC OVARY: This is a rare condition with hormone problems that produces many small cysts on both ovaries. The cysts are follicle-like cysts that never produce an egg and become a corpus luteum. It can cause an increase in body weight, infertility, acne, increase in body and facial hair and lack of menstrual periods or rare menstrual periods. Many women with this problem develop type 2 diabetes. The exact cause of this problem is unknown. A polycystic ovary is rarely cancerous.   THECA LUTEIN CYST: Occurs when too much hormone (human chorionic gonadotropin) is produced and over-stimulates the ovaries to produce an egg. They are frequently seen when doctors stimulate the ovaries for invitro-fertilization (test tube babies).   LUTEOMA CYST: This cyst is seen during pregnancy. Rarely it can cause an obstruction to the birth canal during labor and delivery. They usually go away after delivery.  SYMPTOMS   Pelvic pain or pressure.  Pain during sexual intercourse.   Increasing girth (swelling) of the abdomen.   Abnormal menstrual periods.   Increasing pain with menstrual periods.   You stop having menstrual periods and you are not pregnant.  DIAGNOSIS  The diagnosis can be made during:  Routine or annual pelvic examination (common).   Ultrasound.   X-ray of the pelvis.   CT Scan.   MRI.   Blood tests.  TREATMENT   Treatment may only be to follow the cyst monthly for 2 to 3 months with your  caregiver. Many go away on their own, especially functional cysts.   May be aspirated (drained) with a long needle with ultrasound, or by laparoscopy (inserting a tube into the pelvis through a small incision).   The whole cyst can be removed by laparoscopy.   Sometimes the cyst may need to be removed through an incision in the lower abdomen.   Hormone treatment is sometimes used to help dissolve certain cysts.   Birth control pills are sometimes used to help dissolve certain cysts.  HOME CARE INSTRUCTIONS  Follow your caregiver's advice regarding:  Medicine.   Follow up visits to evaluate and treat the cyst.   You may need to come back or make an appointment with another caregiver, to find the exact cause of your cyst, if your caregiver is not a gynecologist.   Get your yearly and recommended pelvic examinations and Pap tests.   Let your caregiver know if you have had an ovarian cyst in the past.  SEEK MEDICAL CARE IF:   Your periods are late, irregular, they stop, or are painful.   Your stomach (abdomen) or pelvic pain does not go away.   Your stomach becomes larger or swollen.   You have pressure on your bladder or trouble emptying your bladder completely.   You have painful sexual intercourse.   You have feelings of fullness, pressure, or discomfort in your stomach.   You lose weight for no apparent reason.   You feel generally ill.   You become constipated.   You lose your appetite.   You develop acne.   You have an increase in body and facial hair.   You are gaining weight, without changing your exercise and eating habits.   You think you are pregnant.  SEEK IMMEDIATE MEDICAL CARE IF:   You have increasing abdominal pain.   You feel sick to your stomach (nausea) and/or vomit.   You develop a fever that comes on suddenly.   You develop abdominal pain during a bowel movement.   Your menstrual periods become heavier than usual.  Document Released:  09/20/2005 Document Revised: 09/09/2011 Document Reviewed: 07/24/2009 [facility] Patient Information 2012 [place], [place].

## 2012-04-02 NOTE — ED Provider Notes (Signed)
I assumed care of patient from Dr. Weldon Inches at 700.  R flank and lower abdominal pain x 2 days.  CT pending.  CT with R adnexal pathology. No pain on reassessment, abdomen soft.  Patient states previous hysterectomy and L oopherectomy for cysts. Will obtain US to evaluate R adnexa further. Pelvic exam performed with normal external genitalia. Cervix is absent. There is scant white discharge. Patient has right adnexal fullness and mass and tenderness to palpation. No left adnexal masses or tenderness.  Adnexal pathology confirmed on [place]. No torsion. Differential includes hemorrhagic cyst, endometrioma, or neoplasm.  D/w Dr. Rayna Sexton nurse.  Followup information given for reimaging in 6 weeks as recommended.  Patient expressed understanding.  Glynn Octave, MD 04/02/12 1030

## 2012-04-03 LAB — GC/CHLAMYDIA PROBE AMP, GENITAL
Chlamydia, DNA Probe: NEGATIVE
GC Probe Amp, Genital: NEGATIVE

## 2013-09-30 ENCOUNTER — Emergency Department (HOSPITAL_COMMUNITY)
Admission: EM | Admit: 2013-09-30 | Discharge: 2013-09-30 | Discharge status: Against Medical Advice | Payer: Self-pay | Attending: Emergency Medicine | Admitting: Emergency Medicine

## 2013-09-30 ENCOUNTER — Encounter (HOSPITAL_COMMUNITY): Payer: Self-pay | Admitting: Emergency Medicine

## 2013-09-30 DIAGNOSIS — H538 Other visual disturbances: Secondary | ICD-10-CM | POA: Insufficient documentation

## 2013-09-30 DIAGNOSIS — R110 Nausea: Secondary | ICD-10-CM | POA: Insufficient documentation

## 2013-09-30 DIAGNOSIS — R51 Headache: Secondary | ICD-10-CM | POA: Insufficient documentation

## 2013-09-30 DIAGNOSIS — F17200 Nicotine dependence, unspecified, uncomplicated: Secondary | ICD-10-CM | POA: Insufficient documentation

## 2013-09-30 NOTE — ED Notes (Signed)
Pt reports headache since yesterday, pain is at back of her head and moves up. +nausea, +visual disturbances. Denies any head injury or history of migraines.

## 2013-09-30 NOTE — ED Notes (Signed)
Called patient 2 times from lobby and no answer.  6:00pm and 6:15pm

## 2013-10-01 ENCOUNTER — Emergency Department (HOSPITAL_COMMUNITY): Payer: Self-pay

## 2013-10-01 ENCOUNTER — Emergency Department (HOSPITAL_COMMUNITY)
Admission: EM | Admit: 2013-10-01 | Discharge: 2013-10-01 | Disposition: A | Discharge status: Home / Self Care | Payer: Self-pay | Attending: Emergency Medicine | Admitting: Emergency Medicine

## 2013-10-01 ENCOUNTER — Encounter (HOSPITAL_COMMUNITY): Payer: Self-pay | Admitting: Emergency Medicine

## 2013-10-01 DIAGNOSIS — Z8742 Personal history of other diseases of the female genital tract: Secondary | ICD-10-CM | POA: Insufficient documentation

## 2013-10-01 DIAGNOSIS — Z79899 Other long term (current) drug therapy: Secondary | ICD-10-CM | POA: Insufficient documentation

## 2013-10-01 DIAGNOSIS — M436 Torticollis: Secondary | ICD-10-CM | POA: Insufficient documentation

## 2013-10-01 DIAGNOSIS — F17200 Nicotine dependence, unspecified, uncomplicated: Secondary | ICD-10-CM | POA: Insufficient documentation

## 2013-10-01 DIAGNOSIS — M47812 Spondylosis without myelopathy or radiculopathy, cervical region: Secondary | ICD-10-CM | POA: Insufficient documentation

## 2013-10-01 DIAGNOSIS — Z7982 Long term (current) use of aspirin: Secondary | ICD-10-CM | POA: Insufficient documentation

## 2013-10-01 MED ORDER — METHOCARBAMOL 500 MG PO TABS: 1000.0000 mg | ORAL_TABLET | Freq: Four times a day (QID) | ORAL | Status: AC

## 2013-10-01 MED ORDER — TRAMADOL HCL 50 MG PO TABS: 50.0000 mg | ORAL_TABLET | Freq: Four times a day (QID) | ORAL | Status: DC | PRN

## 2013-10-01 MED ORDER — CYCLOBENZAPRINE HCL 10 MG PO TABS
10.0000 mg | ORAL_TABLET | Freq: Once | ORAL | Status: AC
  Administered 2013-10-01: 10 mg via ORAL
  Filled 2013-10-01: qty 1

## 2013-10-01 MED ORDER — CYCLOBENZAPRINE HCL 5 MG PO TABS: 5.0000 mg | ORAL_TABLET | Freq: Three times a day (TID) | ORAL | Status: DC | PRN

## 2013-10-01 NOTE — ED Notes (Signed)
Pt reports headache for 3 days, having sharp pain to back of head that moves up.  No h/o migraine.

## 2013-10-01 NOTE — ED Provider Notes (Signed)
CSN: [id number]     Arrival date & time 10/01/13  9147 History   First MD Initiated Contact with Patient 10/01/13 [phone]     Chief Complaint  Patient presents with  . Headache   (Consider location/radiation/quality/duration/timing/severity/associated sxs/prior Treatment) HPI  Past Medical History  Diagnosis Date  . Ovarian cyst    Past Surgical History  Procedure Laterality Date  . Cesarean section    . Knee surgery    . Partial hysterectomy     No family history on file. History  Substance Use Topics  . Smoking status: Current Every Day Smoker -- 1.00 packs/day for 10 years    Types: Cigarettes  . Smokeless tobacco: Never Used  . Alcohol Use: Yes     Comment: occ   OB History   Grav Para Term Preterm Abortions TAB SAB Ect Mult Living                 Review of Systems  Allergies  Review of patient's allergies indicates no known allergies.  Home Medications   Current Outpatient Rx  Name  Route  Sig  Dispense  Refill  . aspirin 325 MG tablet   Oral   Take 325 mg by mouth daily. To help prevent chest pain         . ibuprofen (ADVIL,MOTRIN) 200 MG tablet   Oral   Take 800 mg by mouth every 6 (six) hours as needed for moderate pain. pain         . methocarbamol (ROBAXIN) 500 MG tablet   Oral   Take 2 tablets (1,000 mg total) by mouth 4 (four) times daily.   40 tablet   0   . nitroGLYCERIN (NITROSTAT) 0.4 MG SL tablet   Sublingual   Place 0.4 mg under the tongue every 5 (five) minutes as needed for chest pain.          . traMADol (ULTRAM) 50 MG tablet   Oral   Take 1 tablet (50 mg total) by mouth every 6 (six) hours as needed.   15 tablet   0    BP 130/64  Pulse 60  Temp(Src) 97.9 F (36.6 C) (Oral)  Resp 18  Ht 5\' 4"  (1.626 m)  Wt 150 lb (68.04 kg)  BMI 25.73 kg/m2  SpO2 100% Physical Exam  ED Course  Procedures (including critical care time) Labs Review Labs Reviewed - No data to display Imaging Review Dg Cervical Spine  Complete  10/01/2013   CLINICAL DATA:  42 year old female with headache and right side neck pain. Initial encounter.  EXAM: CERVICAL SPINE  4+ VIEWS  COMPARISON:  Cervical spine CT 08/16/2003.  FINDINGS: Prevertebral soft tissue contour at the upper limits of normal. Straightening of cervical lordosis. New/ progressed endplate osteophytes at C5-C6. Relatively preserved disc spaces. Bilateral posterior element alignment is within normal limits. AP alignment and lung apices within normal limits. C1-C2 alignment and odontoid within normal limits. Cervicothoracic junction alignment is within normal limits.  IMPRESSION: No acute fracture or listhesis identified in the cervical spine. Ligamentous injury is not excluded.  Degenerative endplate spurring at C5-C6 has developed since 2004.   Electronically Signed   By: Augusto Gamble M.D.   On: 10/01/2013 10:11    EKG Interpretation   None       MDM   1. 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  Followup with PCP if not improving over the next week.  The patient appears reasonably screened and/or stabilized for discharge and I doubt any other medical condition or other [facility] requiring further screening, evaluation, or treatment in the ED at this time prior to discharge.  Patients labs and/or radiological studies were viewed and considered during the medical decision making and disposition process.     Burgess Amor, PA-C 10/01/13 1100

## 2013-10-01 NOTE — ED Provider Notes (Signed)
CSN: [id number]     Arrival date & time 10/01/13  7829 History   First MD Initiated Contact with Patient 10/01/13 [phone]     Chief Complaint  Patient presents with  . Headache   (Consider location/radiation/quality/duration/timing/severity/associated sxs/prior Treatment) HPI Comments: Mary Holland is a 42 y.o. Female with a 3 day history of right sided neck pain, stiffness with radiation into her right posterior scalp.  Her pain is worse with movement and with palpation.  She denies injury but does report episodes of prior pain which generally resolves without intervention.  She has taken ibuprofen without relief of symptoms.  She denies nausea, vomiting, fever, dizziness, visual changes, denies weakness or numbness in her upper extremities.  She denies any recent illnesses.  She has taken ibuprofen without relief of her symptoms.     The history is provided by the patient.    Past Medical History  Diagnosis Date  . Ovarian cyst    Past Surgical History  Procedure Laterality Date  . Cesarean section    . Knee surgery    . Partial hysterectomy     No family history on file. History  Substance Use Topics  . Smoking status: Current Every Day Smoker -- 1.00 packs/day for 10 years    Types: Cigarettes  . Smokeless tobacco: Never Used  . Alcohol Use: Yes     Comment: occ   OB History   Grav Para Term Preterm Abortions TAB SAB Ect Mult Living                 Review of Systems  Constitutional: Negative for fever.  HENT: Negative for ear pain.   Gastrointestinal: Negative for nausea and vomiting.  Musculoskeletal: Positive for arthralgias, joint swelling and neck pain. Negative for myalgias.  Neurological: Positive for headaches. Negative for dizziness, weakness, light-headedness and numbness.    Allergies  Review of patient's allergies indicates no known allergies.  Home Medications   Current Outpatient Rx  Name  Route  Sig  Dispense  Refill  . aspirin 325 MG  tablet   Oral   Take 325 mg by mouth daily. To help prevent chest pain         . ibuprofen (ADVIL,MOTRIN) 200 MG tablet   Oral   Take 800 mg by mouth every 6 (six) hours as needed for moderate pain. pain         . cyclobenzaprine (FLEXERIL) 5 MG tablet   Oral   Take 1 tablet (5 mg total) by mouth 3 (three) times daily as needed for muscle spasms.   15 tablet   0   . nitroGLYCERIN (NITROSTAT) 0.4 MG SL tablet   Sublingual   Place 0.4 mg under the tongue every 5 (five) minutes as needed for chest pain.           BP 130/64  Pulse 60  Temp(Src) 97.9 F (36.6 C) (Oral)  Resp 18  Ht 5\' 4"  (1.626 m)  Wt 150 lb (68.04 kg)  BMI 25.73 kg/m2  SpO2 100% Physical Exam  Constitutional: She appears well-developed and well-nourished.  HENT:  Head: Normocephalic and atraumatic.  Right Ear: Tympanic membrane and ear canal normal. No mastoid tenderness.  Left Ear: Tympanic membrane and ear canal normal. No mastoid tenderness.  Nose: Nose normal.  Eyes: Conjunctivae and EOM are normal. Pupils are equal, round, and reactive to light.  Neck: Muscular tenderness present. No spinous process tenderness present. No rigidity. Decreased range of motion present. No edema  present.  Decreased ROM most pronounced with rightward rotation.  TTP along right SCM and tender right posterior scalp.  No adenopathy.  No scalp lesions.    Cardiovascular: Normal rate.   Pulses equal bilaterally  Pulmonary/Chest: No stridor.  Musculoskeletal: She exhibits tenderness.  Lymphadenopathy:    She has no cervical adenopathy.  Neurological: She is alert. She has normal strength. She displays normal reflexes. No sensory deficit.  Equal strength  Skin: Skin is warm and dry. No rash noted.  Psychiatric: She has a normal mood and affect.    ED Course  Procedures (including critical care time) Labs Review Labs Reviewed - No data to display Imaging Review Dg Cervical Spine Complete  10/01/2013   CLINICAL DATA:   42 year old female with headache and right side neck pain. Initial encounter.  EXAM: CERVICAL SPINE  4+ VIEWS  COMPARISON:  Cervical spine CT 08/16/2003.  FINDINGS: Prevertebral soft tissue contour at the upper limits of normal. Straightening of cervical lordosis. New/ progressed endplate osteophytes at C5-C6. Relatively preserved disc spaces. Bilateral posterior element alignment is within normal limits. AP alignment and lung apices within normal limits. C1-C2 alignment and odontoid within normal limits. Cervicothoracic junction alignment is within normal limits.  IMPRESSION: No acute fracture or listhesis identified in the cervical spine. Ligamentous injury is not excluded.  Degenerative endplate spurring at C5-C6 has developed since 2004.   Electronically Signed   By: Augusto Gamble M.D.   On: 10/01/2013 10:11    EKG Interpretation   None       MDM   1. 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 Pt with reproducible right neck pain with muscle spasm by history and on exam.  Flexeril,  Heat, ibuprofen, ROM exercises.  Recheck by pcp in one week if not improved.    Burgess Amor, PA-C 10/01/13 1032

## 2013-10-03 NOTE — ED Provider Notes (Signed)
History/physical exam/procedure(s) were performed by non-physician practitioner and as supervising physician I was immediately available for consultation/collaboration. I have reviewed all notes and am in agreement with care and plan.   Hilario Quarry, MD 10/03/13 [phone]

## 2013-10-03 NOTE — ED Provider Notes (Signed)
History/physical exam/procedure(s) were performed by non-physician practitioner and as supervising physician I was immediately available for consultation/collaboration. I have reviewed all notes and am in agreement with care and plan.   Hilario Quarry, MD 10/03/13 [phone]

## 2015-05-06 ENCOUNTER — Emergency Department (HOSPITAL_COMMUNITY)
Admission: EM | Admit: 2015-05-06 | Discharge: 2015-05-06 | Disposition: A | Discharge status: Home / Self Care | Payer: Self-pay | Attending: Emergency Medicine | Admitting: Emergency Medicine

## 2015-05-06 ENCOUNTER — Encounter (HOSPITAL_COMMUNITY): Payer: Self-pay | Admitting: Emergency Medicine

## 2015-05-06 DIAGNOSIS — Z8742 Personal history of other diseases of the female genital tract: Secondary | ICD-10-CM | POA: Insufficient documentation

## 2015-05-06 DIAGNOSIS — R1031 Right lower quadrant pain: Secondary | ICD-10-CM | POA: Insufficient documentation

## 2015-05-06 DIAGNOSIS — R011 Cardiac murmur, unspecified: Secondary | ICD-10-CM | POA: Insufficient documentation

## 2015-05-06 DIAGNOSIS — Z720 Tobacco use: Secondary | ICD-10-CM | POA: Insufficient documentation

## 2015-05-06 DIAGNOSIS — G8929 Other chronic pain: Secondary | ICD-10-CM | POA: Insufficient documentation

## 2015-05-06 DIAGNOSIS — R140 Abdominal distension (gaseous): Secondary | ICD-10-CM | POA: Insufficient documentation

## 2015-05-06 DIAGNOSIS — Z90710 Acquired absence of both cervix and uterus: Secondary | ICD-10-CM | POA: Insufficient documentation

## 2015-05-06 DIAGNOSIS — Z3202 Encounter for pregnancy test, result negative: Secondary | ICD-10-CM | POA: Insufficient documentation

## 2015-05-06 HISTORY — DX: Other cervical disc degeneration, unspecified cervical region: M50.30

## 2015-05-06 HISTORY — DX: Sciatica, unspecified side: M54.30

## 2015-05-06 HISTORY — DX: Dorsalgia, unspecified: M54.9

## 2015-05-06 HISTORY — DX: Other chronic pain: G89.29

## 2015-05-06 LAB — CBC WITH DIFFERENTIAL/PLATELET
BASOS ABS: 0 10*3/uL (ref 0.0–0.1)
BASOS PCT: 0 % (ref 0–1)
EOS PCT: 2 % (ref 0–5)
Eosinophils Absolute: 0.3 10*3/uL (ref 0.0–0.7)
HEMATOCRIT: 40.8 % (ref 36.0–46.0)
Hemoglobin: 13.6 g/dL (ref 12.0–15.0)
Lymphocytes Relative: 35 % (ref 12–46)
Lymphs Abs: 3.8 10*3/uL (ref 0.7–4.0)
MCH: 30.2 pg (ref 26.0–34.0)
MCHC: 33.3 g/dL (ref 30.0–36.0)
MCV: 90.7 fL (ref 78.0–100.0)
MONO ABS: 0.6 10*3/uL (ref 0.1–1.0)
Monocytes Relative: 6 % (ref 3–12)
Neutro Abs: 6.2 10*3/uL (ref 1.7–7.7)
Neutrophils Relative %: 57 % (ref 43–77)
PLATELETS: 193 10*3/uL (ref 150–400)
RBC: 4.5 MIL/uL (ref 3.87–5.11)
RDW: 13.8 % (ref 11.5–15.5)
WBC: 10.9 10*3/uL — AB (ref 4.0–10.5)

## 2015-05-06 LAB — URINALYSIS, ROUTINE W REFLEX MICROSCOPIC
Bilirubin Urine: NEGATIVE
Glucose, UA: NEGATIVE mg/dL
Ketones, ur: NEGATIVE mg/dL
Leukocytes, UA: NEGATIVE
Nitrite: NEGATIVE
Protein, ur: NEGATIVE mg/dL
Specific Gravity, Urine: >1.03 — ABNORMAL HIGH (ref 1.005–1.030)
Urobilinogen, UA: 0.2 mg/dL (ref 0.0–1.0)
pH: 6 (ref 5.0–8.0)

## 2015-05-06 LAB — COMPREHENSIVE METABOLIC PANEL
ALK PHOS: 49 U/L (ref 38–126)
ALT: 15 U/L (ref 14–54)
AST: 15 U/L (ref 15–41)
Albumin: 3.9 g/dL (ref 3.5–5.0)
Anion gap: 6 (ref 5–15)
BILIRUBIN TOTAL: 0.2 mg/dL — AB (ref 0.3–1.2)
BUN: 13 mg/dL (ref 6–20)
CHLORIDE: 108 mmol/L (ref 101–111)
CO2: 23 mmol/L (ref 22–32)
Calcium: 8.3 mg/dL — ABNORMAL LOW (ref 8.9–10.3)
Creatinine, Ser: 0.75 mg/dL (ref 0.44–1.00)
GFR calc Af Amer: >60 mL/min (ref >60)
GFR calc non Af Amer: >60 mL/min (ref >60)
Glucose, Bld: 108 mg/dL — ABNORMAL HIGH (ref 65–99)
Potassium: 3.8 mmol/L (ref 3.5–5.1)
SODIUM: 137 mmol/L (ref 135–145)
TOTAL PROTEIN: 7 g/dL (ref 6.5–8.1)

## 2015-05-06 LAB — URINE MICROSCOPIC-ADD ON

## 2015-05-06 LAB — PREGNANCY, URINE: Preg Test, Ur: NEGATIVE

## 2015-05-06 MED ORDER — IBUPROFEN 800 MG PO TABS: 800.0000 mg | ORAL_TABLET | Freq: Three times a day (TID) | ORAL | Status: DC

## 2015-05-06 MED ORDER — KETOROLAC TROMETHAMINE 30 MG/ML IJ SOLN
30.0000 mg | Freq: Once | INTRAMUSCULAR | Status: AC
  Administered 2015-05-06: 30 mg via INTRAVENOUS
  Filled 2015-05-06: qty 1

## 2015-05-06 NOTE — ED Notes (Signed)
Dr Miller at bedside,  

## 2015-05-06 NOTE — ED Notes (Signed)
Pt c/o right lower abd, right flank pain that has been intermittent for the past two weeks, denies any urinary symptoms, denies any n/v/d, denies any fever, states that she has hx of ruptured ovarian cyst and this feels the same,

## 2015-05-06 NOTE — ED Notes (Signed)
Pt has ultrasound scheduled 05-07-2015 for 10:30, arrival time at 10:00, pt instructed to drink 32 ounces of water one hour prior to time of ultrasound,

## 2015-05-06 NOTE — Discharge Instructions (Signed)

## 2015-05-06 NOTE — ED Notes (Signed)
Abdominal pian x 2 weeks right lower, denies vomiting or diarrhea, pt hx of ovarian cyst.

## 2015-05-06 NOTE — ED Provider Notes (Signed)
CSN: [id number]     Arrival date & time 05/06/15  2056 History  This chart was scribed for Mary Hong, MD by Phillis Haggis, ED Scribe. This patient was seen in room APA05/APA05 and patient care was started at 9:42 PM.    Chief Complaint  Patient presents with  . Abdominal Pain   The history is provided by the patient. No language interpreter was used.  HPI Comments: Mary Holland is a 44 y.o. Female with hx of ovarian cyst and partial hysterectomy who presents to the Emergency Department complaining of intermittent right lower abdominal pain onset 2 weeks ago. States that she has a ruptured ovarian cyst and this pain feels similar to the cyst pain. States that it occurs every other day and the pain will hurt all day when it comes on. Reports taking ibuprofen to some relief and believes her stomach is swollen. Denies pain underneath ribs, back pain, hematuria, hematochezia, diarrhea, abnormal colored urine, nausea, or vomiting. Reports hx of 2 C- Sections  Past Medical History  Diagnosis Date  . Ovarian cyst   . Chronic back pain   . Sciatic pain   . DDD (degenerative disc disease), cervical    Past Surgical History  Procedure Laterality Date  . Cesarean section    . Knee surgery    . Partial hysterectomy     No family history on file. History  Substance Use Topics  . Smoking status: Current Every Day Smoker -- 1.00 packs/day for 10 years    Types: Cigarettes  . Smokeless tobacco: Never Used  . Alcohol Use: Yes     Comment: occ   OB History    No data available     Review of Systems  All other systems reviewed and are negative.  Allergies  Review of patient's allergies indicates no known allergies.  Home Medications   Prior to Admission medications   Medication Sig Start Date End Date Taking? Authorizing Provider  ibuprofen (ADVIL,MOTRIN) 800 MG tablet Take 1 tablet (800 mg total) by mouth 3 (three) times daily. 05/06/15   Mary Hong, MD  nitroGLYCERIN (NITROSTAT)  0.4 MG SL tablet Place 0.4 mg under the tongue every 5 (five) minutes as needed for chest pain.     Historical Provider, MD   BP 121/79 mmHg  Pulse 72  Temp(Src) 98.2 F (36.8 C) (Oral)  Resp 18  Wt 150 lb (68.04 kg)  SpO2 100%  Physical Exam  Constitutional: She appears well-developed and well-nourished. No distress.  HENT:  Head: Normocephalic and atraumatic.  Mouth/Throat: Oropharynx is clear and moist. No oropharyngeal exudate.  Eyes: Conjunctivae and EOM are normal. Pupils are equal, round, and reactive to light. Right eye exhibits no discharge. Left eye exhibits no discharge. No scleral icterus.  Neck: Normal range of motion. Neck supple. No JVD present. No thyromegaly present.  Cardiovascular: Normal rate, regular rhythm and intact distal pulses.  Exam reveals no gallop and no friction rub.   Murmur heard. Soft systolic heart murmur  Pulmonary/Chest: Effort normal and breath sounds normal. No respiratory distress. She has no wheezes. She has no rales.  Abdominal: Soft. Bowel sounds are normal. She exhibits distension (mildly). She exhibits no mass. There is tenderness.  Mild TTP to RLQ; mildly distended but diffusely soft  Musculoskeletal: Normal range of motion. She exhibits no edema or tenderness.  Lymphadenopathy:    She has no cervical adenopathy.  Neurological: She is alert. Coordination normal.  Skin: Skin is warm and dry. No rash  noted. No erythema.  Psychiatric: She has a normal mood and affect. Her behavior is normal.  Nursing note and vitals reviewed.   ED Course  Procedures (including critical care time) DIAGNOSTIC STUDIES: Oxygen Saturation is 100% on RA, normal by my interpretation.    COORDINATION OF CARE: 9:45 PM-Discussed treatment plan which includes labs and x-ray with pt at bedside and pt agreed to plan.   Labs Review Labs Reviewed  URINALYSIS, ROUTINE W REFLEX MICROSCOPIC (NOT AT [facility]) - Abnormal; Notable for the following:    Specific Gravity,  Urine >1.030 (*)    Hgb urine dipstick TRACE (*)    All other components within normal limits  CBC WITH DIFFERENTIAL/PLATELET - Abnormal; Notable for the following:    WBC 10.9 (*)    All other components within normal limits  COMPREHENSIVE METABOLIC PANEL - Abnormal; Notable for the following:    Glucose, Bld 108 (*)    Calcium 8.3 (*)    Total Bilirubin 0.2 (*)    All other components within normal limits  URINE MICROSCOPIC-ADD ON - Abnormal; Notable for the following:    Squamous Epithelial / LPF MANY (*)    Bacteria, UA FEW (*)    All other components within normal limits  PREGNANCY, URINE   Imaging Review No results found.   EKG Interpretation None      MDM   Final diagnoses:  Right lower quadrant abdominal pain    Well-appearing, vital signs normal, labs show no significant leukocytosis, no anemia, urinalysis without findings of infection or bleeding, the patient was informed of all of these results, she feels much better after Toradol, she is amenable to return in the morning for an ultrasound. I've encouraged her to have a CT scan tomorrow should her pain persist without evidence on ultrasound of the definite etiology. She is in agreement with this plan.   Meds given in ED:  Medications  ketorolac (TORADOL) 30 MG/ML injection 30 mg (30 mg Intravenous Given 05/06/15 2158)    New Prescriptions   IBUPROFEN (ADVIL,MOTRIN) 800 MG TABLET    Take 1 tablet (800 mg total) by mouth 3 (three) times daily.     I personally performed the services described in this documentation, which was scribed in my presence. The recorded information has been reviewed and is accurate.      Mary Hong, MD 05/06/15 [phone]

## 2015-05-07 ENCOUNTER — Ambulatory Visit (HOSPITAL_COMMUNITY)
Admit: 2015-05-07 | Discharge: 2015-05-07 | Disposition: A | Discharge status: Home / Self Care | Payer: Self-pay | Source: Ambulatory Visit | Attending: Emergency Medicine | Admitting: Emergency Medicine

## 2015-05-07 ENCOUNTER — Other Ambulatory Visit (HOSPITAL_COMMUNITY): Payer: Self-pay | Admitting: Emergency Medicine

## 2015-05-07 DIAGNOSIS — Z8742 Personal history of other diseases of the female genital tract: Secondary | ICD-10-CM

## 2015-05-07 DIAGNOSIS — R1031 Right lower quadrant pain: Secondary | ICD-10-CM

## 2015-05-07 DIAGNOSIS — R938 Abnormal findings on diagnostic imaging of other specified body structures: Secondary | ICD-10-CM | POA: Insufficient documentation

## 2016-03-04 ENCOUNTER — Ambulatory Visit (INDEPENDENT_AMBULATORY_CARE_PROVIDER_SITE_OTHER): Payer: PRIVATE HEALTH INSURANCE | Admitting: Obstetrics & Gynecology

## 2016-03-04 ENCOUNTER — Encounter: Payer: Self-pay | Admitting: Obstetrics & Gynecology

## 2016-03-04 VITALS — BP 102/80 | HR 76 | Ht 64.0 in | Wt 164.0 lb

## 2016-03-04 DIAGNOSIS — N83201 Unspecified ovarian cyst, right side: Secondary | ICD-10-CM

## 2016-03-04 DIAGNOSIS — R1031 Right lower quadrant pain: Secondary | ICD-10-CM

## 2016-03-04 DIAGNOSIS — Z01818 Encounter for other preprocedural examination: Secondary | ICD-10-CM | POA: Diagnosis not present

## 2016-03-04 NOTE — Progress Notes (Signed)
Patient ID: Mary Holland, female   DOB: 23-May-1971, 45 y.o.   MRN: [id number] Preoperative History and Physical  Mary Holland is a 45 y.o. No obstetric history on file. with No LMP recorded. Patient has had a hysterectomy. admitted for a laparoscopic removal of right ovary.  Pt has been having pain in the right adnexal region for the past year or so Went to ED 05/2016 and sonogram revealed a small dermoid cyst However on exam today her ovary is quite tender to palpation, does not move, maybe adherent to pelvic sidewall of top of vagina In any event she cannot work  Comfortably with her pain, she does a lot of lifting twisting and pulling  PMH:    Past Medical History  Diagnosis Date  . Ovarian cyst   . Chronic back pain   . Sciatic pain   . DDD (degenerative disc disease), cervical     PSH:     Past Surgical History  Procedure Laterality Date  . Cesarean section    . Knee surgery    . Partial hysterectomy      POb/GynH:      OB History    No data available      SH:   Social History  Substance Use Topics  . Smoking status: Current Every Day Smoker -- 1.00 packs/day for 10 years    Types: Cigarettes  . Smokeless tobacco: Never Used  . Alcohol Use: Yes     Comment: occ    FH:   History reviewed. No pertinent family history.   Allergies: No Known Allergies  Medications:       Current outpatient prescriptions:  .  acetaminophen (TYLENOL) 500 MG tablet, Take 500 mg by mouth every 6 (six) hours as needed., Disp: , Rfl:   Review of Systems:   Review of Systems  Constitutional: Negative for fever, chills, weight loss, malaise/fatigue and diaphoresis.  HENT: Negative for hearing loss, ear pain, nosebleeds, congestion, sore throat, neck pain, tinnitus and ear discharge.   Eyes: Negative for blurred vision, double vision, photophobia, pain, discharge and redness.  Respiratory: Negative for cough, hemoptysis, sputum production, shortness of breath, wheezing and  stridor.   Cardiovascular: Negative for chest pain, palpitations, orthopnea, claudication, leg swelling and PND.  Gastrointestinal: Positive for abdominal pain. Negative for heartburn, nausea, vomiting, diarrhea, constipation, blood in stool and melena.  Genitourinary: Negative for dysuria, urgency, frequency, hematuria and flank pain.  Musculoskeletal: Negative for myalgias, back pain, joint pain and falls.  Skin: Negative for itching and rash.  Neurological: Negative for dizziness, tingling, tremors, sensory change, speech change, focal weakness, seizures, loss of consciousness, weakness and headaches.  Endo/Heme/Allergies: Negative for environmental allergies and polydipsia. Does not bruise/bleed easily.  Psychiatric/Behavioral: Negative for depression, suicidal ideas, hallucinations, memory loss and substance abuse. The patient is not nervous/anxious and does not have insomnia.      PHYSICAL EXAM:  Blood pressure 102/80, pulse 76, height  (1.626 m), weight 164 lb (74.39 kg).    Vitals reviewed. Constitutional: She is oriented to person, place, and time. She appears well-developed and well-nourished.  HENT:  Head: Normocephalic and atraumatic.  Right Ear: External ear normal.  Left Ear: External ear normal.  Nose: Nose normal.  Mouth/Throat: Oropharynx is clear and moist.  Eyes: Conjunctivae and EOM are normal. Pupils are equal, round, and reactive to light. Right eye exhibits no discharge. Left eye exhibits no discharge. No scleral icterus.  Neck: Normal range of motion. Neck supple. No  tracheal deviation present. No thyromegaly present.  Cardiovascular: Normal rate, regular rhythm, normal heart sounds and intact distal pulses.  Exam reveals no gallop and no friction rub.   No murmur heard. Respiratory: Effort normal and breath sounds normal. No respiratory distress. She has no wheezes. She has no rales. She exhibits no tenderness.  GI: Soft. Bowel sounds are normal. She  exhibits no distension and no mass. There is tenderness. There is no rebound and no guarding.  Genitourinary:       Vulva is normal without lesions Vagina is pink moist without discharge Cervix absent Uterus is uterus absent Adnexa is tender in the RLQ on palpation Musculoskeletal: Normal range of motion. She exhibits no edema and no tenderness.  Neurological: She is alert and oriented to person, place, and time. She has normal reflexes. She displays normal reflexes. No cranial nerve deficit. She exhibits normal muscle tone. Coordination normal.  Skin: Skin is warm and dry. No rash noted. No erythema. No pallor.  Psychiatric: She has a normal mood and affect. Her behavior is normal. Judgment and thought content normal.    Labs: No results found for this or any previous visit (from the past 336 hour(s)).  EKG: Orders placed or performed during the hospital encounter of 04/21/11  . ED EKG  . ED EKG  . EKG test  . EKG test  . EKG  . EKG  . EKG    Imaging Studies: No results found.    Assessment: RLQ abdominal pain  Right ovarian cyst    Plan: Laparoscopic removal of right ovary 03/24/2016  Kenzee Bassin H 03/04/2016 2:59 PM

## 2016-03-16 NOTE — Patient Instructions (Addendum)
Mary Holland  03/16/2016     @   Your procedure is scheduled on 03/24/2016.  Report to [facility] at 6:15 A.M.  Call this number if you have problems the morning of surgery:  [phone]   Remember:  Do not eat food or drink liquids after midnight.  Take these medicines the morning of surgery with A SIP OF WATER None   Do not wear jewelry, make-up or nail polish.  Do not wear lotions, powders, or perfumes.  You may wear deodorant.  Do not shave 48 hours prior to surgery.  Men may shave face and neck.  Do not bring valuables to the hospital.  [facility] is not responsible for any belongings or valuables.  Contacts, dentures or bridgework may not be worn into surgery.  Leave your suitcase in the car.  After surgery it may be brought to your room.  For patients admitted to the hospital, discharge time will be determined by your treatment team.  Patients discharged the day of surgery will not be allowed to drive home.    Please read over the following fact sheets that you were given. Surgical Site Infection Prevention and Anesthesia Post-op Instructions     PATIENT INSTRUCTIONS POST-ANESTHESIA  IMMEDIATELY FOLLOWING SURGERY:  Do not drive or operate machinery for the first twenty four hours after surgery.  Do not make any important decisions for twenty four hours after surgery or while taking narcotic pain medications or sedatives.  If you develop intractable nausea and vomiting or a severe headache please notify your doctor immediately.  FOLLOW-UP:  Please make an appointment with your surgeon as instructed. You do not need to follow up with anesthesia unless specifically instructed to do so.  WOUND CARE INSTRUCTIONS (if applicable):  Keep a dry clean dressing on the anesthesia/puncture wound site if there is drainage.  Once the wound has quit draining you may leave it open to air.  Generally you should leave the bandage intact for twenty four hours  unless there is drainage.  If the epidural site drains for more than 36-48 hours please call the anesthesia department.  QUESTIONS?:  Please feel free to call your physician or the hospital operator if you have any questions, and they will be happy to assist you.         Unilateral Salpingo-Oophorectomy Unilateral salpingo-oophorectomy is the surgical removal of one fallopian tube and ovary. The ovaries are small organs that produce eggs in women. The fallopian tubes transport the egg from the ovary to the womb (uterus). A unilateral salpingo-oophorectomy may be done for various reasons, including:  Infection in the fallopian tube and ovary.  Scar tissue in the fallopian tube and ovary (adhesions).  A cyst or tumor on the ovary.  A need to remove the fallopian tube and ovary when removing the uterus.  Cancer of the fallopian tube or ovary. The removal of one fallopian tube and ovary will not prevent you from becoming pregnant, put you into menopause, or cause problems with your menstrual periods or sex drive. LET [facility] CARE PROVIDER KNOW ABOUT:  Any allergies you have.  All medicines you are taking, including vitamins, herbs, eye drops, creams, and over-the-counter medicines.  Previous problems you or members of your family have had with the use of anesthetics.  Any blood disorders you have.  Previous surgeries you have had.  Medical conditions you have. RISKS AND COMPLICATIONS  Generally, this is a safe procedure. However, as with any procedure,  complications can occur. Possible complications include:  Injury to surrounding organs.  Bleeding.  Infection.  Blood clots in the legs or lungs.  Problems related to anesthesia. BEFORE THE PROCEDURE  Ask your health care provider about changing or stopping your regular medicines. You may need to stop taking certain medicines, such as aspirin or blood thinners, at least 1 week before the surgery.  Do not eat or drink  anything for at least 8 hours before the surgery.  If you smoke, do not smoke for at least 2 weeks before the surgery.  Make plans to have someone drive you home after the procedure or after your hospital stay. Also arrange for someone to help you with activities during recovery. PROCEDURE  You will be given medicine to help you relax before the procedure (sedative). You will then be given medicine to make you sleep through the procedure (general anesthetic). These medicines will be given through an IV access tube that is put into one of your veins.  Once you are asleep, your lower abdomen will be shaved and cleaned. A thin, flexible tube (catheter) will be placed in your bladder.  The surgeon may use a laparoscopic, robotic, or open technique for this surgery:  In the laparoscopic technique, the surgery is done through two small cuts (incisions) in the abdomen. A thin, lighted tube with a tiny camera on the end (laparoscope) is inserted into one of the incisions. The tools needed for the procedure are put through the other incision.  A robotic technique may be chosen to perform complex surgery in a small space. In the robotic technique, small incisions are made. A camera and surgical instruments are passed through the incisions. Surgical instruments are controlled with the help of a robotic arm.  In the open technique, the surgery is done through one large incision in the abdomen.  Using any of these techniques, the surgeon will remove the fallopian tube and ovary. The blood vessels will be clamped and tied.  The surgeon will then use staples or stitches to close the incision or incisions. AFTER THE PROCEDURE  You will be taken to a recovery area where your progress will be monitored for 1-3 hours. Your blood pressure, pulse, and temperature will be checked often. You will remain in the recovery area until you are stable and waking up.  If the laparoscopic technique was used, you may be  allowed to go home after several hours. You may have some shoulder pain. This is normal and usually goes away in a day or two.  If the open technique was used, you will be admitted to the hospital for a couple of days.  You will be given pain medicine as necessary.  The IV tube and catheter will be removed before you are discharged.   This information is not intended to replace advice given to you by your health care provider. Make sure you discuss any questions you have with your health care provider.   Document Released: 07/18/2009 Document Revised: 09/25/2013 Document Reviewed: 03/14/2013 Elsevier Interactive Patient Education 2016 Else[employer] Inc[employer]  Unilateral Salpingo-Oophorectomy, Care After Refer to this sheet in the next few weeks. These instructions provide you with information on caring for yourself after your procedure. Your health care provider may also give you more specific instructions. Your treatment has been planned according to current medical practices, but problems sometimes occur. Call your health care provider if you have any problems or questions after your procedure. WHAT TO EXPECT AFTER THE PROCEDURE  After your procedure, it is typical to have the following:  Abdominal pain that can be controlled with pain medicine.  Vaginal spotting.  Constipation. HOME CARE INSTRUCTIONS   Get plenty of rest and sleep.  Only take over-the-counter or prescription medicines as directed by your health care provider. Do not take aspirin. It can cause bleeding.  Keep incision areas clean and dry. Remove or change any bandages (dressings) only as directed by your health care provider.  Follow your health care provider's advice regarding diet.  Drink enough fluids to keep your urine clear or pale yellow.  Limit exercise and activities as directed by your health care provider. Do not lift anything heavier than 5 pounds (2.3 kg) until your health care provider approves.  Do not  drive until your health care provider approves.  Do not drink alcohol until your health care provider approves.  Do not have sexual intercourse until your health care provider says it is OK.  Take your temperature twice a day and write it down.  If you become constipated, you may:  Ask your health care provider about taking a mild laxative.  Add more fruit and bran to your diet.  Drink more fluids.  Follow up with your health care provider as directed. SEEK MEDICAL CARE IF:   You have swelling or redness in the incision area.  You develop a rash.  You feel lightheaded.  You have pain that is not controlled with medicine.  You have pain, swelling, or redness where the IV access tube was placed. SEEK IMMEDIATE MEDICAL CARE IF:  You have a fever.  You develop increasing abdominal pain.  You see pus coming out of the incision, or the incision is separating.  You notice a bad smell coming from the wound or dressing.  You have excessive vaginal bleeding.  You feel sick to your stomach (nauseous) and vomit.  You have leg or chest pain.  You have pain when you urinate.  You develop shortness of breath.  You pass out.   This information is not intended to replace advice given to you by your health care provider. Make sure you discuss any questions you have with your health care provider.   Document Released: 07/17/2009 Document Revised: 07/11/2013 Document Reviewed: 03/14/2013 Elsevier Interactive Patient Education [employer]2016 Elsevier Inc[employer]

## 2016-03-19 ENCOUNTER — Other Ambulatory Visit (HOSPITAL_COMMUNITY): Payer: Self-pay

## 2016-03-19 ENCOUNTER — Encounter (HOSPITAL_COMMUNITY): Payer: Self-pay

## 2016-03-19 ENCOUNTER — Encounter (HOSPITAL_COMMUNITY)
Admission: RE | Admit: 2016-03-19 | Discharge: 2016-03-19 | Disposition: A | Discharge status: Home / Self Care | Payer: Self-pay | Source: Ambulatory Visit | Attending: Obstetrics & Gynecology | Admitting: Obstetrics & Gynecology

## 2016-03-19 DIAGNOSIS — R1031 Right lower quadrant pain: Secondary | ICD-10-CM | POA: Insufficient documentation

## 2016-03-19 DIAGNOSIS — Z01812 Encounter for preprocedural laboratory examination: Secondary | ICD-10-CM | POA: Insufficient documentation

## 2016-03-19 DIAGNOSIS — N83201 Unspecified ovarian cyst, right side: Secondary | ICD-10-CM | POA: Insufficient documentation

## 2016-03-19 HISTORY — DX: Narcolepsy without cataplexy: G47.419

## 2016-03-19 LAB — CBC
HEMATOCRIT: 38.6 % (ref 36.0–46.0)
Hemoglobin: 13.1 g/dL (ref 12.0–15.0)
MCH: 30.6 pg (ref 26.0–34.0)
MCHC: 33.9 g/dL (ref 30.0–36.0)
MCV: 90.2 fL (ref 78.0–100.0)
PLATELETS: 191 10*3/uL (ref 150–400)
RBC: 4.28 MIL/uL (ref 3.87–5.11)
RDW: 14 % (ref 11.5–15.5)
WBC: 10 10*3/uL (ref 4.0–10.5)

## 2016-03-19 LAB — URINALYSIS, ROUTINE W REFLEX MICROSCOPIC
Bilirubin Urine: NEGATIVE
GLUCOSE, UA: NEGATIVE mg/dL
Ketones, ur: NEGATIVE mg/dL
LEUKOCYTES UA: NEGATIVE
Nitrite: NEGATIVE
PROTEIN: NEGATIVE mg/dL
Specific Gravity, Urine: 1.025 (ref 1.005–1.030)
pH: 6 (ref 5.0–8.0)

## 2016-03-19 LAB — URINE MICROSCOPIC-ADD ON

## 2016-03-19 LAB — COMPREHENSIVE METABOLIC PANEL
ALBUMIN: 4 g/dL (ref 3.5–5.0)
ALT: 16 U/L (ref 14–54)
ANION GAP: 6 (ref 5–15)
AST: 17 U/L (ref 15–41)
Alkaline Phosphatase: 43 U/L (ref 38–126)
BILIRUBIN TOTAL: 0.5 mg/dL (ref 0.3–1.2)
BUN: 14 mg/dL (ref 6–20)
CHLORIDE: 107 mmol/L (ref 101–111)
CO2: 22 mmol/L (ref 22–32)
Calcium: 8.5 mg/dL — ABNORMAL LOW (ref 8.9–10.3)
Creatinine, Ser: 0.8 mg/dL (ref 0.44–1.00)
GFR calc Af Amer: >60 mL/min (ref >60)
GFR calc non Af Amer: >60 mL/min (ref >60)
GLUCOSE: 121 mg/dL — AB (ref 65–99)
POTASSIUM: 3.7 mmol/L (ref 3.5–5.1)
Sodium: 135 mmol/L (ref 135–145)
TOTAL PROTEIN: 7 g/dL (ref 6.5–8.1)

## 2016-03-19 NOTE — Pre-Procedure Instructions (Signed)
Patient given information to sign up for my chart at home. 

## 2016-03-24 ENCOUNTER — Ambulatory Visit (HOSPITAL_COMMUNITY): Payer: Self-pay | Admitting: Anesthesiology

## 2016-03-24 ENCOUNTER — Encounter (HOSPITAL_COMMUNITY)
Admission: RE | Disposition: A | Discharge status: Home / Self Care | Payer: Self-pay | Source: Ambulatory Visit | Attending: Obstetrics & Gynecology

## 2016-03-24 ENCOUNTER — Encounter (HOSPITAL_COMMUNITY): Payer: Self-pay

## 2016-03-24 ENCOUNTER — Ambulatory Visit (HOSPITAL_COMMUNITY): Admit: 2016-03-24 | Payer: Self-pay | Admitting: Obstetrics & Gynecology

## 2016-03-24 ENCOUNTER — Ambulatory Visit (HOSPITAL_COMMUNITY)
Admission: RE | Admit: 2016-03-24 | Discharge: 2016-03-24 | Disposition: A | Discharge status: Home / Self Care | Payer: Self-pay | Source: Ambulatory Visit | Attending: Obstetrics & Gynecology | Admitting: Obstetrics & Gynecology

## 2016-03-24 ENCOUNTER — Encounter (HOSPITAL_COMMUNITY): Payer: Self-pay | Admitting: *Deleted

## 2016-03-24 DIAGNOSIS — R1031 Right lower quadrant pain: Secondary | ICD-10-CM | POA: Diagnosis not present

## 2016-03-24 DIAGNOSIS — N83202 Unspecified ovarian cyst, left side: Secondary | ICD-10-CM | POA: Insufficient documentation

## 2016-03-24 DIAGNOSIS — F17210 Nicotine dependence, cigarettes, uncomplicated: Secondary | ICD-10-CM | POA: Insufficient documentation

## 2016-03-24 DIAGNOSIS — G47419 Narcolepsy without cataplexy: Secondary | ICD-10-CM | POA: Insufficient documentation

## 2016-03-24 DIAGNOSIS — M5430 Sciatica, unspecified side: Secondary | ICD-10-CM | POA: Insufficient documentation

## 2016-03-24 DIAGNOSIS — N83201 Unspecified ovarian cyst, right side: Secondary | ICD-10-CM | POA: Insufficient documentation

## 2016-03-24 DIAGNOSIS — M5030 Other cervical disc degeneration, unspecified cervical region: Secondary | ICD-10-CM | POA: Insufficient documentation

## 2016-03-24 DIAGNOSIS — Z9889 Other specified postprocedural states: Secondary | ICD-10-CM

## 2016-03-24 DIAGNOSIS — G709 Myoneural disorder, unspecified: Secondary | ICD-10-CM | POA: Insufficient documentation

## 2016-03-24 DIAGNOSIS — G8929 Other chronic pain: Secondary | ICD-10-CM | POA: Insufficient documentation

## 2016-03-24 SURGERY — OOPHORECTOMY, LAPAROSCOPIC
Anesthesia: General | Laterality: Right

## 2016-03-24 MED ORDER — GLYCOPYRROLATE 0.2 MG/ML IJ SOLN
INTRAMUSCULAR | Status: AC
  Filled 2016-03-24: qty 3

## 2016-03-24 MED ORDER — GLYCOPYRROLATE 0.2 MG/ML IJ SOLN
INTRAMUSCULAR | Status: AC
  Filled 2016-03-24: qty 1

## 2016-03-24 MED ORDER — FENTANYL CITRATE (PF) 100 MCG/2ML IJ SOLN
INTRAMUSCULAR | Status: DC | PRN
  Administered 2016-03-24 (×5): 50 ug via INTRAVENOUS

## 2016-03-24 MED ORDER — SODIUM CHLORIDE 0.9 % IJ SOLN
INTRAMUSCULAR | Status: AC
  Filled 2016-03-24: qty 10

## 2016-03-24 MED ORDER — FENTANYL CITRATE (PF) 100 MCG/2ML IJ SOLN: 25.0000 ug | INTRAMUSCULAR | Status: DC | PRN

## 2016-03-24 MED ORDER — PROPOFOL 10 MG/ML IV BOLUS
INTRAVENOUS | Status: DC | PRN
  Administered 2016-03-24: 150 mg via INTRAVENOUS

## 2016-03-24 MED ORDER — MIDAZOLAM HCL 2 MG/2ML IJ SOLN
INTRAMUSCULAR | Status: AC
  Filled 2016-03-24: qty 2

## 2016-03-24 MED ORDER — BUPIVACAINE LIPOSOME 1.3 % IJ SUSP
INTRAMUSCULAR | Status: DC | PRN
  Administered 2016-03-24: 20 mL

## 2016-03-24 MED ORDER — LACTATED RINGERS IV SOLN
INTRAVENOUS | Status: DC
  Administered 2016-03-24: 1000 mL via INTRAVENOUS

## 2016-03-24 MED ORDER — LIDOCAINE HCL (CARDIAC) 20 MG/ML IV SOLN
INTRAVENOUS | Status: DC | PRN
  Administered 2016-03-24: 50 mg via INTRAVENOUS

## 2016-03-24 MED ORDER — GLYCOPYRROLATE 0.2 MG/ML IJ SOLN
INTRAMUSCULAR | Status: DC | PRN
  Administered 2016-03-24: 0.6 mg via INTRAVENOUS
  Administered 2016-03-24: 0.2 mg via INTRAVENOUS

## 2016-03-24 MED ORDER — BUPIVACAINE LIPOSOME 1.3 % IJ SUSP
INTRAMUSCULAR | Status: AC
  Filled 2016-03-24: qty 20

## 2016-03-24 MED ORDER — EPHEDRINE SULFATE 50 MG/ML IJ SOLN
INTRAMUSCULAR | Status: AC
  Filled 2016-03-24: qty 1

## 2016-03-24 MED ORDER — LIDOCAINE HCL (PF) 1 % IJ SOLN
INTRAMUSCULAR | Status: AC
  Filled 2016-03-24: qty 5

## 2016-03-24 MED ORDER — NEOSTIGMINE METHYLSULFATE 10 MG/10ML IV SOLN
INTRAVENOUS | Status: DC | PRN
  Administered 2016-03-24: 4 mg via INTRAVENOUS

## 2016-03-24 MED ORDER — PROPOFOL 10 MG/ML IV BOLUS
INTRAVENOUS | Status: AC
  Filled 2016-03-24: qty 40

## 2016-03-24 MED ORDER — LACTATED RINGERS IV SOLN
INTRAVENOUS | Status: DC | PRN
  Administered 2016-03-24 (×2): via INTRAVENOUS

## 2016-03-24 MED ORDER — ROCURONIUM BROMIDE 100 MG/10ML IV SOLN
INTRAVENOUS | Status: DC | PRN
  Administered 2016-03-24: 35 mg via INTRAVENOUS

## 2016-03-24 MED ORDER — ROCURONIUM BROMIDE 50 MG/5ML IV SOLN
INTRAVENOUS | Status: AC
  Filled 2016-03-24: qty 1

## 2016-03-24 MED ORDER — ONDANSETRON HCL 4 MG/2ML IJ SOLN: 4.0000 mg | Freq: Once | INTRAMUSCULAR | Status: DC | PRN

## 2016-03-24 MED ORDER — KETOROLAC TROMETHAMINE 30 MG/ML IJ SOLN
30.0000 mg | Freq: Once | INTRAMUSCULAR | Status: AC
  Administered 2016-03-24: 30 mg via INTRAVENOUS
  Filled 2016-03-24: qty 1

## 2016-03-24 MED ORDER — SUCCINYLCHOLINE CHLORIDE 20 MG/ML IJ SOLN
INTRAMUSCULAR | Status: AC
  Filled 2016-03-24: qty 1

## 2016-03-24 MED ORDER — BUPIVACAINE LIPOSOME 1.3 % IJ SUSP
20.0000 mL | Freq: Once | INTRAMUSCULAR | Status: DC
  Filled 2016-03-24: qty 20

## 2016-03-24 MED ORDER — NEOSTIGMINE METHYLSULFATE 10 MG/10ML IV SOLN
INTRAVENOUS | Status: AC
  Filled 2016-03-24: qty 1

## 2016-03-24 MED ORDER — CEFAZOLIN SODIUM-DEXTROSE 2-4 GM/100ML-% IV SOLN
2.0000 g | INTRAVENOUS | Status: AC
  Administered 2016-03-24: 2 g via INTRAVENOUS
  Filled 2016-03-24: qty 100

## 2016-03-24 MED ORDER — MIDAZOLAM HCL 2 MG/2ML IJ SOLN
1.0000 mg | INTRAMUSCULAR | Status: DC | PRN
  Administered 2016-03-24: 2 mg via INTRAVENOUS

## 2016-03-24 MED ORDER — ONDANSETRON HCL 4 MG/2ML IJ SOLN
4.0000 mg | Freq: Once | INTRAMUSCULAR | Status: AC
  Administered 2016-03-24: 4 mg via INTRAVENOUS

## 2016-03-24 MED ORDER — ONDANSETRON HCL 8 MG PO TABS: 8.0000 mg | ORAL_TABLET | Freq: Three times a day (TID) | ORAL | Status: DC | PRN

## 2016-03-24 MED ORDER — ONDANSETRON HCL 4 MG/2ML IJ SOLN
INTRAMUSCULAR | Status: AC
  Filled 2016-03-24: qty 2

## 2016-03-24 MED ORDER — KETOROLAC TROMETHAMINE 10 MG PO TABS: 10.0000 mg | ORAL_TABLET | Freq: Three times a day (TID) | ORAL | Status: DC | PRN

## 2016-03-24 MED ORDER — FENTANYL CITRATE (PF) 250 MCG/5ML IJ SOLN
INTRAMUSCULAR | Status: AC
  Filled 2016-03-24: qty 5

## 2016-03-24 MED ORDER — HYDROCODONE-ACETAMINOPHEN 5-325 MG PO TABS: 1.0000 | ORAL_TABLET | Freq: Four times a day (QID) | ORAL | Status: DC | PRN

## 2016-03-24 MED ORDER — SODIUM CHLORIDE 0.9 % IR SOLN
Status: DC | PRN
  Administered 2016-03-24: 1000 mL
  Administered 2016-03-24: 3000 mL

## 2016-03-24 SURGICAL SUPPLY — 45 items
APPLICATOR ARISTA FLEXITIP XL (MISCELLANEOUS) ×3 IMPLANT
APPLIER CLIP 5 13 M/L LIGAMAX5 (MISCELLANEOUS)
BAG HAMPER (MISCELLANEOUS) ×3 IMPLANT
BLADE SURG SZ11 CARB STEEL (BLADE) ×3 IMPLANT
CLIP APPLIE 5 13 M/L LIGAMAX5 (MISCELLANEOUS) IMPLANT
CLOTH BEACON ORANGE TIMEOUT ST (SAFETY) ×3 IMPLANT
COVER LIGHT HANDLE STERIS (MISCELLANEOUS) ×6 IMPLANT
DRAPE PROXIMA HALF (DRAPES) ×3 IMPLANT
ELECT REM PT RETURN 9FT ADLT (ELECTROSURGICAL) ×3
ELECTRODE REM PT RTRN 9FT ADLT (ELECTROSURGICAL) ×1 IMPLANT
FILTER SMOKE EVAC LAPAROSHD (FILTER) IMPLANT
FORMALIN 10 PREFIL 120ML (MISCELLANEOUS) ×3 IMPLANT
GLOVE BIOGEL PI IND STRL 7.0 (GLOVE) ×1 IMPLANT
GLOVE BIOGEL PI IND STRL 8 (GLOVE) ×1 IMPLANT
GLOVE BIOGEL PI INDICATOR 7.0 (GLOVE) ×2
GLOVE BIOGEL PI INDICATOR 8 (GLOVE) ×2
GLOVE ECLIPSE 8.0 STRL XLNG CF (GLOVE) ×3 IMPLANT
GOWN STRL REUS W/TWL LRG LVL3 (GOWN DISPOSABLE) ×3 IMPLANT
GOWN STRL REUS W/TWL XL LVL3 (GOWN DISPOSABLE) ×3 IMPLANT
HEMOSTAT ARISTA ABSORB 3G PWDR (MISCELLANEOUS) ×3 IMPLANT
INST SET LAPROSCOPIC GYN AP (KITS) ×3 IMPLANT
IV NS IRRIG 3000ML ARTHROMATIC (IV SOLUTION) ×3 IMPLANT
KIT ROOM TURNOVER AP CYSTO (KITS) ×3 IMPLANT
MANIFOLD NEPTUNE II (INSTRUMENTS) ×3 IMPLANT
NEEDLE HYPO 21X1.5 SAFETY (NEEDLE) ×3 IMPLANT
NEEDLE INSUFFLATION 14GA 120MM (NEEDLE) ×3 IMPLANT
NS IRRIG 1000ML POUR BTL (IV SOLUTION) ×3 IMPLANT
PACK PERI GYN (CUSTOM PROCEDURE TRAY) ×3 IMPLANT
PAD ARMBOARD 7.5X6 YLW CONV (MISCELLANEOUS) ×3 IMPLANT
SET BASIN LINEN APH (SET/KITS/TRAYS/PACK) ×3 IMPLANT
SET TUBE IRRIG SUCTION NO TIP (IRRIGATION / IRRIGATOR) ×3 IMPLANT
SHEARS HARMONIC ACE PLUS 36CM (ENDOMECHANICALS) ×3 IMPLANT
SLEEVE ENDOPATH XCEL 5M (ENDOMECHANICALS) ×3 IMPLANT
SOLUTION ANTI FOG 6CC (MISCELLANEOUS) ×3 IMPLANT
SPONGE GAUZE 2X2 8PLY STER LF (GAUZE/BANDAGES/DRESSINGS) ×3
SPONGE GAUZE 2X2 8PLY STRL LF (GAUZE/BANDAGES/DRESSINGS) ×6 IMPLANT
STAPLER VISISTAT 35W (STAPLE) ×3 IMPLANT
SUT VICRYL 0 UR6 27IN ABS (SUTURE) ×3 IMPLANT
SYR 20CC LL (SYRINGE) ×3 IMPLANT
SYRINGE 10CC LL (SYRINGE) ×3 IMPLANT
TAPE CLOTH SURG 4X10 WHT LF (GAUZE/BANDAGES/DRESSINGS) ×3 IMPLANT
TROCAR ENDO BLADELESS 11MM (ENDOMECHANICALS) ×3 IMPLANT
TROCAR XCEL NON-BLD 5MMX100MML (ENDOMECHANICALS) ×3 IMPLANT
TUBING INSUF HEATED (TUBING) ×3 IMPLANT
WARMER LAPAROSCOPE (MISCELLANEOUS) ×3 IMPLANT

## 2016-03-24 NOTE — Op Note (Signed)
Preoperative diagnosis:  1.  Right Lower quadrant pain                                         2.  Right ovary adherent to right pelvic sidewall  Postoperative diagnosis:  Same as above  Procedure:  Laparoscopic right salpingo-oophorectomy  Surgeon:  Rockne CoonsLuther Holland Concha Sudol Jr Mary Holland  Anesthesia:  Gen. Endotracheal  Findings: Mary Holland is a 45 y.o. No obstetric history on file. with No LMP recorded. Patient has had a hysterectomy. admitted for a laparoscopic removal of right ovary.  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  This did not appear to be a malignant condition.  Description of operation:  The patient was taken to the operating room and placed in the supine position where she underwent general endotracheal anesthesia.  She was placed in the low lithotomy position.  She was prepped and draped in the usual sterile fashion and a Foley catheter was placed.  An incision was made in the umbilicus and a Veres needle was placed into the peritoneal cavity with one pass without difficulty.  The peritoneal cavity was then insufflated.  An 11 mm non-bladed direct visualization trocar was then used and placed into the peritoneal cavity with direct laparoscopic visualization again without difficulty.  Incisions were then made in the midline suprapubic area and right lower quadrant.  A 5 mm trocar was placed in both incision sites.  Both were done under direct visualization without difficulty.  The right ovary was grasped.  The harmonic  scalpel was used and the infundibulopelvic ligament on the right was coagulated and then transected.  He remaining peritoneal attachments of the right ovary were also taken down hemostatically.  The ovary was densely adherent up high on the pelvic sidewall.   An Endo Catch was placed in the right ovary was put in the bag and removed.  Mary Holland(potato starch was injected onto the surgical site since the ovary was chiseled off of the pelvic sidewall.The subcutaneous tissue was then reapproximated with 0 Vicryl. The skin was closed using skin staples.  The umbilical fascia was closed with a single 0 Vicryl suture.  The subcutaneous tissue was reapproximated loosely using 0 Vicryl.  The skin was closed using skin staples.  The patient was awakened from anesthesia and taken to the recovery room in good stable condition with all counts being correct x3.  There was no bleeding from the intraperitoneal surgery at all.  The patient received Ancef preoperatively.  She also received Toradol IV preoperatively.  She will be discharged from the recovery room time and seen next week for staple removal.  Mary Holland,Mary Tarango Holland, Mary Holland 03/24/2016 8:40 AM

## 2016-03-24 NOTE — Discharge Instructions (Signed)
Laparoscopic Ovarian removal Surgery The ovaries are female reproductive organs that produce eggs. Ovarian torsion is when an ovary becomes twisted and cuts off its own blood supply. If the ovary becomes twisted, it cannot get blood and the ovary swells. This swelling can cause extreme pelvic pain that can come and go. Laparoscopic ovarian torsion surgery is needed to untwist the ovary and restore blood flow to the ovary.  LET YOUR CAREGIVER KNOW ABOUT:   Allergies to food or medicine.  Medicines taken, including vitamins, herbs, eyedrops, over-the-counter medicines, and creams.  Use of steroids (by mouth or creams).  Previous problems with anesthetics or numbing medicines.  History of bleeding problems or blood clots.  Previous surgery.  Other health problems, including hypertension, diabetes, and kidney problems.  Possibility of pregnancy. RISKS AND COMPLICATIONS  Allergies to medicines.  Difficulty breathing.  Bleeding.  Infection.  Damage to other structures near the ovaries. BEFORE THE PROCEDURE  If possible, ask your caregiver about changing or stopping your regular medicines.  If possible, do not eat or drink anything for at least 8 hours before your surgery.  If possible, stop smoking (if you smoke). Stopping will improve your health after surgery.  Arrange for a ride home after surgery and for help at home during recovery. PROCEDURE  An intravenous (IV) access tube will be put into one of your vein in order to give you fluids and medicines.  You will receive medicines to relax you and medicines that make you sleep (general anesthetic).  You may have a flexible tube (catheter) put into your bladder to drain urine.  You may have a tube put through your nose or mouth into your stomach (nasogastric tube). The nasogastric tube removes digestive fluids and prevents you from feeling sick to your stomach (nauseous) and throwing up (vomiting).  Three to four small  incisions (laparoscopically) or an open incision will be made in the abdomen. Your surgeon will decide the appropriate approach.  The ovary will be untwisted with small instruments inserted through the laparoscopic incisions.  The ovary is then observed to see if blood flow returns. If blood flow cannot be restored to the ovary, it may have to be surgically removed. AFTER THE PROCEDURE   Plan to be in the hospital for 1 day or less.  You may have abdominal cramping and a sore throat. Your pain will be controlled with medicine.  You may have a liquid diet temporarily. You will most likely return to, and tolerate, your usual diet the day after surgery.  You will be passing urine through a catheter. The catheter will be removed after surgery.  Your temperature, breathing rate, heart rate, blood pressure, and oxygen level will be monitored regularly.  You will still wear compression stockings on your legs until you are able to move around.  You will use a device or do breathing exercises to keep your lungs clear.  You will be encouraged to walk as soon as possible.  Expect a full recovery in 4 to 6 weeks after surgery.   This information is not intended to replace advice given to you by your health care provider. Make sure you discuss any questions you have with your health care provider.   Document Released: 12/13/2011 Document Reviewed: 12/13/2011 Elsevier Interactive Patient Education [employer]2016 [employer] Inc.

## 2016-03-24 NOTE — Anesthesia Procedure Notes (Addendum)
Procedure Name: Intubation Date/Time: 03/24/2016 7:36 AM Performed by: Pernell DupreADAMS, Keyairra Kolinski A Pre-anesthesia Checklist: Patient identified, Patient being monitored, Timeout performed, Emergency Drugs available and Suction available Patient Re-evaluated:Patient Re-evaluated prior to inductionOxygen Delivery Method: Circle system utilized Preoxygenation: Pre-oxygenation with 100% oxygen Intubation Type: IV induction Ventilation: Mask ventilation without difficulty Laryngoscope Size: 3 and Miller Grade View: Grade I Tube type: Oral Tube size: 7.0 mm Number of attempts: 1 Airway Equipment and Method: Stylet Placement Confirmation: ETT inserted through vocal cords under direct vision,  positive ETCO2 and breath sounds checked- equal and bilateral Secured at: 21 cm Tube secured with: Tape Dental Injury: Teeth and Oropharynx as per pre-operative assessment    Performed by: Briarrose Shor A

## 2016-03-24 NOTE — Anesthesia Postprocedure Evaluation (Signed)
Anesthesia Post Note Late Entry  Patient: Mary Holland  Procedure(s) Performed: Procedure(s) (LRB): LAPAROSCOPIC OOPHORECTOMY (Right)  Patient location during evaluation: PACU Anesthesia Type: General Level of consciousness: awake and alert and oriented Pain management: pain level controlled Vital Signs Assessment: post-procedure vital signs reviewed and stable Respiratory status: spontaneous breathing and patient connected to face mask oxygen Cardiovascular status: stable Postop Assessment: no signs of nausea or vomiting Anesthetic complications: no    Last Vitals:  Filed Vitals:   03/24/16 0930 03/24/16 0945  BP: 148/83 148/84  Pulse: 52 65  Temp:    Resp: 19 26    Last Pain:  Filed Vitals:   03/24/16 0947  PainSc: 0-No pain                 ADAMS, AMY A

## 2016-03-24 NOTE — Interval H&P Note (Signed)
History and Physical Interval Note:  03/24/2016 7:24 AM  Mary Holland  has presented today for surgery, with the diagnosis of RLQ pain  The various methods of treatment have been discussed with the patient and family. After consideration of risks, benefits and other options for treatment, the patient has consented to  Procedure(s): LAPAROSCOPIC OOPHORECTOMY (Right) as a surgical intervention .  The patient's history has been reviewed, patient examined, no change in status, stable for surgery.  I have reviewed the patient's chart and labs.  Questions were answered to the patient's satisfaction.     Wen Munford H

## 2016-03-24 NOTE — H&P (Signed)
Preoperative History and Physical  Aldean Astamela N Epping is a 45 y.o. No obstetric history on file. with No LMP recorded. Patient has had a hysterectomy. admitted for a laparoscopic removal of right ovary.  Pt has been having pain in the right adnexal region for the past year or so Went to ED 05/2016 and sonogram revealed a small dermoid cyst However on exam today her ovary is quite tender to palpation, does not move, maybe adherent to pelvic sidewall of top of vagina In any event she cannot work Comfortably with her pain, she does a lot of lifting twisting and pulling  PMH:  Past Medical History  Diagnosis Date  . Ovarian cyst   . Chronic back pain   . Sciatic pain   . DDD (degenerative disc disease), cervical     PSH:  Past Surgical History  Procedure Laterality Date  . Cesarean section    . Knee surgery    . Partial hysterectomy      POb/GynH:  OB History    No data available      SH:  Social History  Substance Use Topics  . Smoking status: Current Every Day Smoker -- 1.00 packs/day for 10 years    Types: Cigarettes  . Smokeless tobacco: Never Used  . Alcohol Use: Yes     Comment: occ    FH: History reviewed. No pertinent family history.   Allergies: No Known Allergies  Medications:  Current outpatient prescriptions:  . acetaminophen (TYLENOL) 500 MG tablet, Take 500 mg by mouth every 6 (six) hours as needed., Disp: , Rfl:   Review of Systems:   Review of Systems  Constitutional: Negative for fever, chills, weight loss, malaise/fatigue and diaphoresis.  HENT: Negative for hearing loss, ear pain, nosebleeds, congestion, sore throat, neck pain, tinnitus and ear discharge.  Eyes: Negative for blurred vision, double vision, photophobia, pain, discharge and redness.  Respiratory: Negative for cough, hemoptysis, sputum production, shortness of breath, wheezing and stridor.   Cardiovascular: Negative for chest pain, palpitations, orthopnea, claudication, leg swelling and PND.  Gastrointestinal: Positive for abdominal pain. Negative for heartburn, nausea, vomiting, diarrhea, constipation, blood in stool and melena.  Genitourinary: Negative for dysuria, urgency, frequency, hematuria and flank pain.  Musculoskeletal: Negative for myalgias, back pain, joint pain and falls.  Skin: Negative for itching and rash.  Neurological: Negative for dizziness, tingling, tremors, sensory change, speech change, focal weakness, seizures, loss of consciousness, weakness and headaches.  Endo/Heme/Allergies: Negative for environmental allergies and polydipsia. Does not bruise/bleed easily.  Psychiatric/Behavioral: Negative for depression, suicidal ideas, hallucinations, memory loss and substance abuse. The patient is not nervous/anxious and does not have insomnia.     PHYSICAL EXAM:  Blood pressure 102/80, pulse 76, height 5\' 4"  (1.626 m), weight 164 lb (74.39 kg).   Vitals reviewed. Constitutional: She is oriented to person, place, and time. She appears well-developed and well-nourished.  HENT:  Head: Normocephalic and atraumatic.  Right Ear: External ear normal.  Left Ear: External ear normal.  Nose: Nose normal.  Mouth/Throat: Oropharynx is clear and moist.  Eyes: Conjunctivae and EOM are normal. Pupils are equal, round, and reactive to light. Right eye exhibits no discharge. Left eye exhibits no discharge. No scleral icterus.  Neck: Normal range of motion. Neck supple. No tracheal deviation present. No thyromegaly present.  Cardiovascular: Normal rate, regular rhythm, normal heart sounds and intact distal pulses. Exam reveals no gallop and no friction rub.  No murmur heard. Respiratory: Effort normal and breath sounds normal. No  respiratory distress. She has no wheezes. She has no rales. She exhibits no tenderness.  GI: Soft. Bowel sounds are normal. She exhibits no  distension and no mass. There is tenderness. There is no rebound and no guarding.  Genitourinary:   Vulva is normal without lesions Vagina is pink moist without discharge Cervix absent Uterus is uterus absent Adnexa is tender in the RLQ on palpation Musculoskeletal: Normal range of motion. She exhibits no edema and no tenderness.  Neurological: She is alert and oriented to person, place, and time. She has normal reflexes. She displays normal reflexes. No cranial nerve deficit. She exhibits normal muscle tone. Coordination normal.  Skin: Skin is warm and dry. No rash noted. No erythema. No pallor.  Psychiatric: She has a normal mood and affect. Her behavior is normal. Judgment and thought content normal.    Labs: No results found for this or any previous visit (from the past 336 hour(s)).  EKG: Orders placed or performed during the hospital encounter of 04/21/11  . ED EKG  . ED EKG  . EKG test  . EKG test  . EKG  . EKG  . EKG    Imaging Studies:  Imaging Results    No results found.      Assessment: RLQ abdominal pain  Right ovarian cyst    Plan: Laparoscopic removal of right ovary 03/24/2016  Evon Lopezperez H 03/04/2016 2:59 PM         Pt understands the risks of surgery including but not limited t  excessive bleeding requiring transfusion or reoperation, post-operative infection requiring prolonged hospitalization or re-hospitalization and antibiotic therapy, and damage to other organs including bladder, bowel, ureters and major vessels.  The patient also understands the alternative treatment options which were discussed in full.  All questions were answered.  Marissa Weaver H 03/24/2016 7:24 AM

## 2016-03-24 NOTE — Anesthesia Preprocedure Evaluation (Signed)
Anesthesia Evaluation  Patient identified by MRN, date of birth, ID band Patient awake    Reviewed: Allergy & Precautions, NPO status , Patient's Chart, lab work & pertinent test results  Airway Mallampati: I  TM Distance: >3 FB     Dental  (+) Teeth Intact, Dental Advisory Given   Pulmonary Current Smoker,    breath sounds clear to auscultation       Cardiovascular negative cardio ROS   Rhythm:Regular     Neuro/Psych PSYCHIATRIC DISORDERS (narcolepsy)  Neuromuscular disease (sciatica)    GI/Hepatic negative GI ROS,   Endo/Other    Renal/GU      Musculoskeletal   Abdominal   Peds  Hematology   Anesthesia Other Findings   Reproductive/Obstetrics                             Anesthesia Physical Anesthesia Plan  ASA: II  Anesthesia Plan: General   Post-op Pain Management:    Induction: Intravenous  Airway Management Planned: Oral ETT  Additional Equipment:   Intra-op Plan:   Post-operative Plan: Extubation in OR  Informed Consent: I have reviewed the patients History and Physical, chart, labs and discussed the procedure including the risks, benefits and alternatives for the proposed anesthesia with the patient or authorized representative who has indicated his/her understanding and acceptance.     Plan Discussed with:   Anesthesia Plan Comments:         Anesthesia Quick Evaluation

## 2016-03-24 NOTE — Transfer of Care (Signed)
Immediate Anesthesia Transfer of Care Note  Patient: Mary Holland  Procedure(s) Performed: Procedure(s): LAPAROSCOPIC OOPHORECTOMY (Right)  Patient Location: PACU  Anesthesia Type:General  Level of Consciousness: awake, oriented and patient cooperative  Airway & Oxygen Therapy: Patient Spontanous Breathing and Patient connected to face mask oxygen  Post-op Assessment: Report given to RN and Post -op Vital signs reviewed and stable  Post vital signs: Reviewed and stable  Last Vitals:  Filed Vitals:   03/24/16 0700 03/24/16 0715  BP: 133/77 132/80  Pulse:    Temp:    Resp: 29 18    Last Pain: There were no vitals filed for this visit.       Complications: No apparent anesthesia complications

## 2016-03-31 ENCOUNTER — Encounter: Payer: Self-pay | Admitting: Obstetrics & Gynecology

## 2016-03-31 ENCOUNTER — Ambulatory Visit (INDEPENDENT_AMBULATORY_CARE_PROVIDER_SITE_OTHER): Payer: PRIVATE HEALTH INSURANCE | Admitting: Obstetrics & Gynecology

## 2016-03-31 VITALS — BP 120/80 | HR 84 | Wt 162.0 lb

## 2016-03-31 DIAGNOSIS — Z9889 Other specified postprocedural states: Secondary | ICD-10-CM

## 2016-03-31 MED ORDER — ESTRADIOL 2 MG PO TABS: 2.0000 mg | ORAL_TABLET | Freq: Every day | ORAL | Status: DC

## 2016-03-31 NOTE — Progress Notes (Signed)
Patient ID: Mary Holland, female   DOB: October 24, 1970, 45 y.o.   MRN: [id number]  HPI: Patient returns for routine postoperative follow-up having undergone laparoscopic right salpingo oophorectomy on 03/24/2016.  The patient's immediate postoperative recovery has been unremarkable. Since hospital discharge the patient reports no roblems.   Current Outpatient Prescriptions: HYDROcodone-acetaminophen (NORCO/VICODIN) 5-325 MG tablet, Take 1 tablet by mouth every 6 (six) hours as needed., Disp: 30 tablet, Rfl: 0 ketorolac (TORADOL) 10 MG tablet, Take 1 tablet (10 mg total) by mouth every 8 (eight) hours as needed., Disp: 15 tablet, Rfl: 0 ondansetron (ZOFRAN) 8 MG tablet, Take 1 tablet (8 mg total) by mouth every 8 (eight) hours as needed for nausea., Disp: 12 tablet, Rfl: 0 acetaminophen (TYLENOL) 500 MG tablet, Take 500 mg by mouth every 6 (six) hours as needed for moderate pain. Reported on 03/31/2016, Disp: , Rfl:  estradiol (ESTRACE) 2 MG tablet, Take 1 tablet (2 mg total) by mouth daily., Disp: 30 tablet, Rfl: 11  No current facility-administered medications for this visit.    Blood pressure 120/80, pulse 84, weight 162 lb (73.483 kg).  Physical Exam: Incision clean dry intact Staples removed x 3   Diagnostic Tests:   Pathology: benign  Impression: S/p laparocopic RSO  Plan: Begin estrace 2 mg daily  Follow up: 1  years  Meds ordered this encounter  Medications  . estradiol (ESTRACE) 2 MG tablet    Sig: Take 1 tablet (2 mg total) by mouth daily.    Dispense:  30 tablet    Refill:  11    Lazaro ArmsEURE,LUTHER H, MD

## 2016-11-24 ENCOUNTER — Emergency Department (HOSPITAL_COMMUNITY)
Admission: EM | Admit: 2016-11-24 | Discharge: 2016-11-24 | Disposition: A | Discharge status: Home / Self Care | Payer: No Typology Code available for payment source | Attending: Emergency Medicine | Admitting: Emergency Medicine

## 2016-11-24 ENCOUNTER — Encounter (HOSPITAL_COMMUNITY): Payer: Self-pay

## 2016-11-24 DIAGNOSIS — B349 Viral infection, unspecified: Secondary | ICD-10-CM

## 2016-11-24 DIAGNOSIS — F17210 Nicotine dependence, cigarettes, uncomplicated: Secondary | ICD-10-CM | POA: Insufficient documentation

## 2016-11-24 MED ORDER — BENZONATATE 100 MG PO CAPS: 100.0000 mg | ORAL_CAPSULE | Freq: Three times a day (TID) | ORAL | 0 refills | Status: DC | PRN

## 2016-11-24 MED ORDER — OSELTAMIVIR PHOSPHATE 75 MG PO CAPS
75.0000 mg | ORAL_CAPSULE | Freq: Two times a day (BID) | ORAL | 0 refills | Status: DC
Start: 2016-11-24 — End: 2017-08-29

## 2016-11-24 MED ORDER — ONDANSETRON 4 MG PO TBDP: 4.0000 mg | ORAL_TABLET | Freq: Three times a day (TID) | ORAL | 0 refills | Status: DC | PRN

## 2016-11-24 NOTE — ED Provider Notes (Signed)
[facility]-EMERGENCY DEPT Provider Note   CSN: [id number] Arrival date & time: 11/24/16  [id number]     History   Chief Complaint Chief Complaint  Patient presents with  . Influenza    HPI Mary Holland is a 46 y.o. female.  HPI  Pt was seen at 0720.  Per pt, c/o gradual onset and persistence of constant sore throat, runny/stuffy nose, sinus congestion, and cough for the past 2-3 days. Has been associated with several episodes of N/V, sneezing, and generalized body aches/fatigue.  Denies fevers, no rash, no CP/SOB, no diarrhea, no abd pain.    Past Medical History:  Diagnosis Date  . Chronic back pain   . DDD (degenerative disc disease), cervical   . Narcolepsy   . Ovarian cyst   . Sciatic pain     There are no active problems to display for this patient.   Past Surgical History:  Procedure Laterality Date  . CESAREAN SECTION    . KNEE SURGERY Right    arthroscopy  . partial hysterectomy    . removal rt ovary      OB History    No data available       Home Medications    Prior to Admission medications   Medication Sig Start Date End Date Taking? Authorizing Provider  acetaminophen (TYLENOL) 500 MG tablet Take 500 mg by mouth every 6 (six) hours as needed for moderate pain. Reported on 03/31/2016   Yes Historical Provider, MD  estradiol (ESTRACE) 2 MG tablet Take 1 tablet (2 mg total) by mouth daily. 03/31/16  Yes Lazaro ArmsLuther H Eure, MD  HYDROcodone-acetaminophen (NORCO/VICODIN) 5-325 MG tablet Take 1 tablet by mouth every 6 (six) hours as needed. 03/24/16  Yes Lazaro ArmsLuther H Eure, MD  ketorolac (TORADOL) 10 MG tablet Take 1 tablet (10 mg total) by mouth every 8 (eight) hours as needed. 03/24/16   Lazaro ArmsLuther H Eure, MD  ondansetron (ZOFRAN) 8 MG tablet Take 1 tablet (8 mg total) by mouth every 8 (eight) hours as needed for nausea. 03/24/16   Lazaro ArmsLuther H Eure, MD    Family History No family history on file.  Social History Social History  Substance Use Topics  . Smoking status:  Current Every Day Smoker    Packs/day: 1.00    Years: 10.00    Types: Cigarettes  . Smokeless tobacco: Never Used  . Alcohol use Yes     Comment: occ     Allergies   Patient has no known allergies.   Review of Systems Review of Systems ROS: Statement: All systems negative except as marked or noted in the HPI; Constitutional: Negative for fever and chills. +generalized body aches/fatigue. ; ; Eyes: Negative for eye pain, redness and discharge. ; ; ENMT: Negative for ear pain, hoarseness, +sneezing, nasal congestion, sinus pressure and sore throat. ; ; Cardiovascular: Negative for chest pain, palpitations, diaphoresis, dyspnea and peripheral edema. ; ; Respiratory: +cough. Negative for wheezing and stridor. ; ; Gastrointestinal: +N/V. Negative for diarrhea, abdominal pain, blood in stool, hematemesis, jaundice and rectal bleeding. . ; ; Genitourinary: Negative for dysuria, flank pain and hematuria. ; ; Musculoskeletal: Negative for back pain and neck pain. Negative for swelling and trauma.; ; Skin: Negative for pruritus, rash, abrasions, blisters, bruising and skin lesion.; ; Neuro: Negative for headache, lightheadedness and neck stiffness. Negative for weakness, altered level of consciousness, altered mental status, extremity weakness, paresthesias, involuntary movement, seizure and syncope.       Physical Exam Updated Vital Signs BP  151/91 (BP Location: Left Arm)   Pulse 81   Temp 98.7 F (37.1 C)   Resp 16   Ht 5\' 4"  (1.626 m)   Wt 170 lb (77.1 kg)   SpO2 100%   BMI 29.18 kg/m   Physical Exam 0725: Physical examination:  Nursing notes reviewed; Vital signs and O2 SAT reviewed;  Constitutional: Well developed, Well nourished, Well hydrated, In no acute distress; Head:  Normocephalic, atraumatic; Eyes: EOMI, PERRL, No scleral icterus; ENMT: TM's clear bilat. +edemetous nasal turbinates bilat with clear rhinorrhea. Mouth and pharynx without lesions. No tonsillar exudates. No  intra-oral edema. No submandibular or sublingual edema. No hoarse voice, no drooling, no stridor. No pain with manipulation of larynx. No trismus. Mouth and pharynx normal, Mucous membranes moist; Neck: Supple, Full range of motion, No lymphadenopathy; Cardiovascular: Regular rate and rhythm, No gallop; Respiratory: Breath sounds clear & equal bilaterally, No wheezes.  Speaking full sentences with ease, Normal respiratory effort/excursion; Chest: Nontender, Movement normal; Abdomen: Soft, Nontender, Nondistended, Normal bowel sounds; Genitourinary: No CVA tenderness; Extremities: Pulses normal, No tenderness, No edema, No calf edema or asymmetry.; Neuro: AA&Ox3, Major CN grossly intact.  Speech clear. No gross focal motor or sensory deficits in extremities.; Skin: Color normal, Warm, Dry.   ED Treatments / Results  Labs (all labs ordered are listed, but only abnormal results are displayed)   EKG  EKG Interpretation None       Radiology   Procedures Procedures (including critical care time)  Medications Ordered in ED Medications - No data to display   Initial Impression / Assessment and Plan / ED Course  I have reviewed the triage vital signs and the nursing notes.  Pertinent labs & imaging results that were available during my care of the patient were reviewed by me and considered in my medical decision making (see chart for details).  MDM Reviewed: previous chart, nursing note and vitals    0730:  Tx symptomatically, f/u PMD. Dx d/w pt.  Questions answered.  Verb understanding, agreeable to d/c home with outpt f/u.   Final Clinical Impressions(s) / ED Diagnoses   Final diagnoses:  None    New Prescriptions New Prescriptions   No medications on file     Samuel Jester, DO 11/28/16 1521

## 2016-11-24 NOTE — ED Triage Notes (Signed)
Body aches, cough, sneezing, runny nose, sore throat, and vomited a couple of times.

## 2016-11-24 NOTE — Discharge Instructions (Signed)
Take over the counter decongestant (such as sudafed), as directed on packaging, for the next week.  Use over the counter normal saline nasal spray, with frequent nose blowing, several times per day for the next 2 weeks.Take over the counter tylenol and ibuprofen, as directed on packaging, as needed for discomfort.  Gargle with warm water several times per day to help with discomfort.  May also use over the counter sore throat pain medicines such as chloraseptic or sucrets, as directed on packaging, as needed for discomfort.  Call your regular medical doctor today to schedule a follow up appointment this week.  Return to the Emergency Department immediately if worsening. ° ° °

## 2017-06-16 ENCOUNTER — Other Ambulatory Visit: Payer: Self-pay | Admitting: Obstetrics & Gynecology

## 2017-08-29 ENCOUNTER — Emergency Department (HOSPITAL_COMMUNITY): Payer: No Typology Code available for payment source

## 2017-08-29 ENCOUNTER — Emergency Department (HOSPITAL_COMMUNITY)
Admission: EM | Admit: 2017-08-29 | Discharge: 2017-08-30 | Disposition: A | Discharge status: Home / Self Care | Payer: No Typology Code available for payment source | Attending: Emergency Medicine | Admitting: Emergency Medicine

## 2017-08-29 ENCOUNTER — Encounter (HOSPITAL_COMMUNITY): Payer: Self-pay | Admitting: *Deleted

## 2017-08-29 ENCOUNTER — Other Ambulatory Visit: Payer: Self-pay

## 2017-08-29 DIAGNOSIS — B9789 Other viral agents as the cause of diseases classified elsewhere: Secondary | ICD-10-CM | POA: Diagnosis not present

## 2017-08-29 DIAGNOSIS — J069 Acute upper respiratory infection, unspecified: Secondary | ICD-10-CM | POA: Insufficient documentation

## 2017-08-29 DIAGNOSIS — R05 Cough: Secondary | ICD-10-CM | POA: Diagnosis present

## 2017-08-29 NOTE — ED Triage Notes (Signed)
Pt reports cough and congestion. Pt also reports pain in her chest when taking a deep breath and or coughing.

## 2017-08-30 MED ORDER — ALBUTEROL SULFATE HFA 108 (90 BASE) MCG/ACT IN AERS
2.0000 | INHALATION_SPRAY | Freq: Once | RESPIRATORY_TRACT | Status: AC
  Administered 2017-08-30: 2 via RESPIRATORY_TRACT
  Filled 2017-08-30: qty 6.7

## 2017-08-30 MED ORDER — AEROCHAMBER PLUS FLO-VU SMALL MISC
1.0000 | Freq: Once | Status: AC
  Administered 2017-08-30: 1
  Filled 2017-08-30: qty 1

## 2017-08-30 MED ORDER — PROMETHAZINE-DM 6.25-15 MG/5ML PO SYRP: 5.0000 mL | ORAL_SOLUTION | Freq: Four times a day (QID) | ORAL | 0 refills | Status: DC | PRN

## 2017-08-30 MED ORDER — DEXAMETHASONE SODIUM PHOSPHATE 10 MG/ML IJ SOLN
10.0000 mg | Freq: Once | INTRAMUSCULAR | Status: AC
  Administered 2017-08-30: 10 mg via INTRAMUSCULAR
  Filled 2017-08-30: qty 1

## 2017-08-30 MED ORDER — IPRATROPIUM-ALBUTEROL 0.5-2.5 (3) MG/3ML IN SOLN
3.0000 mL | Freq: Once | RESPIRATORY_TRACT | Status: AC
  Administered 2017-08-30: 3 mL via RESPIRATORY_TRACT
  Filled 2017-08-30: qty 3

## 2017-08-30 NOTE — ED Notes (Signed)
Pt tolerated ambulation well with o2 sats maintaining at 100%.

## 2017-08-30 NOTE — ED Provider Notes (Signed)
[facility] Provider Note   CSN: [id number] Arrival date & time: 08/29/17  1907     History   Chief Complaint Chief Complaint  Patient presents with  . Cough    HPI Mary Holland is a 46 y.o. female who presents to the emergency department with a chief complaint of nonproductive cough times 4 days with worsening chest tightness and rhinorrhea.  She denies fever, chills, otalgia, sore throat, back pain, sinus pain or pressure, headache, or nasal congestion.  She has taken Mucinex and Dayquil without relief.  No sick contacts.  She is a current, everyday 1/2 ppd smoker.  No history of asthma or COPD.  The history is provided by the patient. No language interpreter was used.  Cough  This is a new problem. The current episode started more than 2 days ago. The problem occurs every few minutes. The problem has been gradually worsening. The cough is non-productive. There has been no fever. Associated symptoms include rhinorrhea. Pertinent negatives include no chest pain, no chills, no sweats, no ear congestion, no ear pain, no headaches, no sore throat, no myalgias, no shortness of breath, no wheezing and no eye redness. Associated symptoms comments: Chest tightness. Treatments tried: Dayquil and Mucinex. The treatment provided mild relief. Her past medical history does not include pneumonia, COPD or asthma.    Past Medical History:  Diagnosis Date  . Chronic back pain   . DDD (degenerative disc disease), cervical   . Narcolepsy   . Ovarian cyst   . Sciatic pain     There are no active problems to display for this patient.   Past Surgical History:  Procedure Laterality Date  . ABDOMINAL HYSTERECTOMY    . CESAREAN SECTION    . KNEE SURGERY Right    arthroscopy  . partial hysterectomy    . removal of left ovary    . removal rt ovary      OB History    No data available       Home Medications    Prior to Admission medications   Medication Sig  Start Date End Date Taking? Authorizing Provider  promethazine-dextromethorphan (PROMETHAZINE-DM) 6.25-15 MG/5ML syrup Take 5 mLs by mouth 4 (four) times daily as needed for cough. 08/30/17   Merril Nagy A, PA-C    Family History No family history on file.  Social History Social History   Tobacco Use  . Smoking status: Current Every Day Smoker    Packs/day: 1.00    Years: 10.00    Pack years: 10.00    Types: Cigarettes  . Smokeless tobacco: Never Used  Substance Use Topics  . Alcohol use: Yes    Comment: occ  . Drug use: No     Allergies   Patient has no known allergies.   Review of Systems Review of Systems  Constitutional: Negative for chills and fever.  HENT: Positive for rhinorrhea. Negative for congestion, ear pain, sinus pressure, sinus pain and sore throat.   Eyes: Negative for redness.  Respiratory: Positive for cough and chest tightness. Negative for choking, shortness of breath and wheezing.   Cardiovascular: Negative for chest pain, palpitations and leg swelling.  Gastrointestinal: Negative for abdominal pain, diarrhea, nausea and vomiting.  Musculoskeletal: Negative for myalgias.  Allergic/Immunologic: Negative for immunocompromised state.  Neurological: Negative for dizziness, syncope, light-headedness and headaches.     Physical Exam Updated Vital Signs BP 114/75 (BP Location: Left Arm)   Pulse 76   Temp 98 F (36.7  C) (Oral)   Resp 18   Ht 5\' 4"  (1.626 m)   Wt 81.6 kg (180 lb)   SpO2 98%   BMI 30.90 kg/m   Physical Exam  Constitutional: No distress.  HENT:  Head: Normocephalic.  Right Ear: Tympanic membrane and external ear normal. No mastoid tenderness. Tympanic membrane is not bulging.  Left Ear: Tympanic membrane and external ear normal. No mastoid tenderness. Tympanic membrane is not bulging.  Nose: Rhinorrhea present. Right sinus exhibits no maxillary sinus tenderness and no frontal sinus tenderness. Left sinus exhibits no maxillary  sinus tenderness and no frontal sinus tenderness.  Mouth/Throat: Uvula is midline and mucous membranes are normal. Posterior oropharyngeal erythema present. No oropharyngeal exudate, posterior oropharyngeal edema or tonsillar abscesses.  Eyes: Conjunctivae are normal.  Neck: Neck supple.  Cardiovascular: Normal rate, regular rhythm, normal heart sounds and intact distal pulses. Exam reveals no gallop and no friction rub.  No murmur heard. Pulmonary/Chest: Effort normal. No stridor. No respiratory distress. She has no wheezes. She has no rales. She exhibits no tenderness.  Mildly decreased air movement. The patient has a deep, wet-sounding cough that is worse with deep inspiration.  Abdominal: Soft. She exhibits no distension and no mass. There is no tenderness. There is no guarding.  Lymphadenopathy:    She has no cervical adenopathy.  Neurological: She is alert.  Skin: Skin is warm. No rash noted. She is not diaphoretic.  Psychiatric: Her behavior is normal.  Nursing note and vitals reviewed.    ED Treatments / Results  Labs (all labs ordered are listed, but only abnormal results are displayed) Labs Reviewed - No data to display  EKG  EKG Interpretation None       Radiology Dg Chest 2 View  Result Date: 08/29/2017 CLINICAL DATA:  Nonproductive cough for 4 days. Left-sided chest pain and short of breath today. EXAM: CHEST  2 VIEW COMPARISON:  04/21/2011 FINDINGS: Normal heart size. Lungs clear. No pneumothorax. No pleural effusion. IMPRESSION: No active cardiopulmonary disease. Electronically Signed   By: Jolaine Click M.D.   On: 08/29/2017 20:33    Procedures Procedures (including critical care time)  Medications Ordered in ED Medications  albuterol (PROVENTIL HFA;VENTOLIN HFA) 108 (90 Base) MCG/ACT inhaler 2 puff (not administered)  AEROCHAMBER PLUS FLO-VU SMALL device MISC 1 each (not administered)  dexamethasone (DECADRON) injection 10 mg (not administered)    ipratropium-albuterol (DUONEB) 0.5-2.5 (3) MG/3ML nebulizer solution 3 mL (3 mLs Nebulization Given 08/30/17 0053)     Initial Impression / Assessment and Plan / ED Course  I have reviewed the triage vital signs and the nursing notes.  Pertinent labs & imaging results that were available during my care of the patient were reviewed by me and considered in my medical decision making (see chart for details).     Pt CXR negative for acute infiltrate. Patients symptoms are consistent with URI, likely viral etiology. Discussed that antibiotics are not indicated for viral infections.  Patient was given a DuoNeb in the emergency department and reports improvement in her chest tightness. SaO2 99-100% on room air after Duoneb with ambulation through the department.  Decadron given in the ED.  Pt will be discharged with symptomatic treatment, occluding an albuterol inhaler and spacer and promethazine DM.  Verbalizes understanding and is agreeable with plan.  Patient counseled on smoking cessation. Strict return precautions given.  Pt is hemodynamically stable & in NAD prior to dc.  Final Clinical Impressions(s) / ED Diagnoses   Final diagnoses:  Viral URI with cough    ED Discharge Orders        Ordered    promethazine-dextromethorphan (PROMETHAZINE-DM) 6.25-15 MG/5ML syrup  4 times daily PRN     08/30/17 0131       Leeum Sankey, Pedro EarlsMia A, PA-C 08/30/17 0139    Zadie RhineWickline, Donald, MD 08/30/17 [phone]

## 2017-08-30 NOTE — Discharge Instructions (Signed)
Take two puffs of the albuterol inhaler every 4 hours as needed for shortness of breath or chest tightness. Please note- this medication can make you feel jittery and cause your heart to race. This is a normal side effect of this medication so please do not take it more than every 4 hours.  Take 5 mLs of promethazine-DM cough syrup every 6 hours as needed for cough and nasal congestion.   There is also a medication called guaifenesin that is a medication will help you break up the congestion in your lungs that you can take too.  Do not take this medication with DayQuil or other over-the-counter medications because it can cause your blood pressure to elevate.  If you develop new or worsening symptoms, including fever, back pain, or worsening chest tightness or shortness of breath despite taking these medications, please return to the emergency department for reevaluation.

## 2018-01-17 ENCOUNTER — Emergency Department (HOSPITAL_COMMUNITY): Payer: No Typology Code available for payment source

## 2018-01-17 ENCOUNTER — Encounter (HOSPITAL_COMMUNITY): Payer: Self-pay | Admitting: Emergency Medicine

## 2018-01-17 ENCOUNTER — Emergency Department (HOSPITAL_COMMUNITY)
Admission: EM | Admit: 2018-01-17 | Discharge: 2018-01-17 | Disposition: A | Discharge status: Home / Self Care | Payer: No Typology Code available for payment source | Attending: Emergency Medicine | Admitting: Emergency Medicine

## 2018-01-17 DIAGNOSIS — M7122 Synovial cyst of popliteal space [Baker], left knee: Secondary | ICD-10-CM | POA: Insufficient documentation

## 2018-01-17 DIAGNOSIS — M25562 Pain in left knee: Secondary | ICD-10-CM

## 2018-01-17 DIAGNOSIS — F17210 Nicotine dependence, cigarettes, uncomplicated: Secondary | ICD-10-CM | POA: Insufficient documentation

## 2018-01-17 DIAGNOSIS — M79605 Pain in left leg: Secondary | ICD-10-CM | POA: Diagnosis present

## 2018-01-17 LAB — BASIC METABOLIC PANEL
Anion gap: 8 (ref 5–15)
BUN: 16 mg/dL (ref 6–20)
CALCIUM: 9.2 mg/dL (ref 8.9–10.3)
CO2: 28 mmol/L (ref 22–32)
Chloride: 101 mmol/L (ref 101–111)
Creatinine, Ser: 0.72 mg/dL (ref 0.44–1.00)
Glucose, Bld: 92 mg/dL (ref 65–99)
Potassium: 4.1 mmol/L (ref 3.5–5.1)
Sodium: 137 mmol/L (ref 135–145)

## 2018-01-17 LAB — CBC WITH DIFFERENTIAL/PLATELET
BASOS ABS: 0 10*3/uL (ref 0.0–0.1)
BASOS PCT: 0 %
Eosinophils Absolute: 0.3 10*3/uL (ref 0.0–0.7)
Eosinophils Relative: 2 %
HCT: 40.3 % (ref 36.0–46.0)
Hemoglobin: 13.1 g/dL (ref 12.0–15.0)
Lymphocytes Relative: 41 %
Lymphs Abs: 4.6 10*3/uL — ABNORMAL HIGH (ref 0.7–4.0)
MCH: 29.6 pg (ref 26.0–34.0)
MCHC: 32.5 g/dL (ref 30.0–36.0)
MCV: 91.2 fL (ref 78.0–100.0)
MONO ABS: 0.6 10*3/uL (ref 0.1–1.0)
Monocytes Relative: 6 %
NEUTROS ABS: 5.7 10*3/uL (ref 1.7–7.7)
Neutrophils Relative %: 51 %
PLATELETS: 231 10*3/uL (ref 150–400)
RBC: 4.42 MIL/uL (ref 3.87–5.11)
RDW: 14.1 % (ref 11.5–15.5)
WBC: 11.2 10*3/uL — AB (ref 4.0–10.5)

## 2018-01-17 MED ORDER — NAPROXEN 500 MG PO TABS: 500.0000 mg | ORAL_TABLET | Freq: Two times a day (BID) | ORAL | 0 refills | Status: DC

## 2018-01-17 NOTE — Discharge Instructions (Addendum)
Elevate and apply ice packs on and off to your knee.  You may wear the knee sleeve as needed for support, but do not wear continuously or at bedtime.  Call Dr. Mort SawyersHarrison's office in 1 week to arrange a follow-up appointment if not improving

## 2018-01-17 NOTE — ED Triage Notes (Signed)
Patient complaining of left leg swelling and pain x 1 week. Denies injury. Denies recent travel.

## 2018-01-17 NOTE — ED Provider Notes (Signed)
[facility] Provider [facility]   CSN: [id number] Arrival date & time: 01/17/18  [id number]     History   Chief Complaint Chief Complaint  Patient presents with  . Leg Swelling    HPI Mary Astamela N Dower is a 47 y.o. female.  HPI   Mary Holland is a 47 y.o. female who presents to the Emergency Department complaining of persistent pain and swelling of her left thigh and knee for 1 week.  She describes a constant, dull throbbing pain that radiates from her lower thigh through her knee and into her calf.  Pain is associated with bending and weightbearing.  Improves somewhat at rest.  She denies known injury, but states she does a lot of standing and walking at her job.  She denies redness or excessive swelling into her lower leg.  No history of extended travel or sedentary lifestyle.  Patient does smoke.  No current birth control.  No history of blood disorders, DVTs, PEs.  Past Medical History:  Diagnosis Date  . Chronic back pain   . DDD (degenerative disc disease), cervical   . Narcolepsy   . Ovarian cyst   . Sciatic pain     There are no active problems to display for this patient.   Past Surgical History:  Procedure Laterality Date  . ABDOMINAL HYSTERECTOMY    . CESAREAN SECTION    . KNEE SURGERY Right    arthroscopy  . partial hysterectomy    . removal of left ovary    . removal rt ovary       OB History   None      Home Medications    Prior to Admission medications   Medication Sig Start Date End Date Taking? Authorizing Provider  promethazine-dextromethorphan (PROMETHAZINE-DM) 6.25-15 MG/5ML syrup Take 5 mLs by mouth 4 (four) times daily as needed for cough. 08/30/17   McDonald, Coral ElseMia A, PA-C    Family History History reviewed. No pertinent family history.  Social History Social History   Tobacco Use  . Smoking status: Current Every Day Smoker    Packs/day: 1.00    Years: 10.00    Pack years: 10.00    Types: Cigarettes  . Smokeless  tobacco: Never Used  Substance Use Topics  . Alcohol use: Never    Frequency: Never  . Drug use: No     Allergies   Patient has no known allergies.   Review of Systems Review of Systems  Constitutional: Negative for chills and fever.  Respiratory: Negative for chest tightness and shortness of breath.   Cardiovascular: Negative for chest pain.  Gastrointestinal: Negative for nausea and vomiting.  Musculoskeletal: Positive for arthralgias (Left knee pain and swelling), joint swelling and myalgias (Pain and swelling of the lower left calf).  Skin: Negative for color change, rash and wound.  Neurological: Negative for weakness and numbness.  All other systems reviewed and are negative.    Physical Exam Updated Vital Signs BP (!) 108/91 (BP Location: Left Arm)   Pulse 71   Temp 97.8 F (36.6 C) (Oral)   Resp 18   Ht 5\' 4"  (1.626 m)   Wt 79.4 kg (175 lb)   SpO2 99%   BMI 30.04 kg/m   Physical Exam  Constitutional: She is oriented to person, place, and time. She appears well-developed and well-nourished. No distress.  HENT:  Head: Atraumatic.  Cardiovascular: Normal rate, regular rhythm and intact distal pulses.  Pulmonary/Chest: Effort normal and breath sounds normal.  Musculoskeletal: She exhibits edema and tenderness. She exhibits no deformity.  Diffuse ttp of the anterior left knee. Moderate edema of the distal thigh.  No erythema, excessive warmth, effusion, or step-off deformity.  Tenderness also extends from popliteal fossa and posterior calf.    Neurological: She is alert and oriented to person, place, and time. No sensory deficit. She exhibits normal muscle tone. Coordination normal.  Skin: Skin is warm and dry. Capillary refill takes less than 2 seconds. No erythema.  Nursing note and vitals reviewed.    ED Treatments / Results  Labs (all labs ordered are listed, but only abnormal results are displayed) Labs Reviewed  CBC WITH DIFFERENTIAL/PLATELET -  Abnormal; Notable for the following components:      Result Value   WBC 11.2 (*)    Lymphs Abs 4.6 (*)    All other components within normal limits  BASIC METABOLIC PANEL    EKG None  Radiology US Venous Img Lower Unilateral Left  Result Date: 01/17/2018 CLINICAL DATA:  Pain and swelling x1 week EXAM: LEFT LOWER EXTREMITY VENOUS DOPPLER ULTRASOUND TECHNIQUE: Gray-scale sonography with compression, as well as color and duplex ultrasound, were performed to evaluate the deep venous system from the level of the common femoral vein through the popliteal and proximal calf veins. COMPARISON:  None FINDINGS: Normal compressibility of the common femoral, superficial femoral, and popliteal veins, as well as the proximal calf veins. No filling defects to suggest DVT on grayscale or color Doppler imaging. Doppler waveforms show normal direction of venous flow, normal respiratory phasicity and response to augmentation. There is an elongated 5.6 x 1.4 x 4.7 cm fluid collection in the posterior popliteal fossa. Visualized segments of the saphenous venous system normal in caliber and compressibility. Survey views of the contralateral common femoral vein are unremarkable. IMPRESSION: 1.  No evidence of LEFT lower extremity deep vein thrombosis. 2. Left Baker's cyst. Electronically Signed   By: Corlis Leak M.D.   On: 01/17/2018 10:52   Dg Knee Complete 4 Views Left  Result Date: 01/17/2018 CLINICAL DATA:  Pain, swelling, posterior left knee pain EXAM: LEFT KNEE - COMPLETE 4+ VIEW COMPARISON:  None. FINDINGS: Early degenerative changes with early spurring. No acute bony abnormality. Specifically, no fracture, subluxation, or dislocation. No joint effusion. IMPRESSION: Early degenerative changes.  No acute bony abnormality. Electronically Signed   By: Charlett Nose M.D.   On: 01/17/2018 10:46    Procedures Procedures (including critical care time)  Medications Ordered in ED Medications - No data to  display   Initial Impression / Assessment and Plan / ED Course  I have reviewed the triage vital signs and the nursing notes.  Pertinent labs & imaging results that were available during my care of the patient were reviewed by me and considered in my medical decision making (see chart for details).     Pt is well appearing.  Vitals reviewed.  No concerning sx's for septic joint.  US shows no evidence of DVT.  No skin changes to suggest cellulitis.  NV intact  Knee sleeve applied for support.  Pt agrees to plan which includes elevation, ice and close orthopedic follow-up in 1 week if not improving.  Return precautions were discussed.  Final Clinical Impressions(s) / ED Diagnoses   Final diagnoses:  Acute pain of left knee  Baker's cyst of knee, left    ED Discharge Orders    None       Pauline Aus, PA-C 01/17/18 1137  Vanetta Mulders, MD 01/18/18 [phone]

## 2018-02-08 ENCOUNTER — Ambulatory Visit: Payer: PRIVATE HEALTH INSURANCE | Admitting: Orthopedic Surgery

## 2018-02-08 ENCOUNTER — Encounter: Payer: Self-pay | Admitting: Orthopedic Surgery

## 2018-04-26 ENCOUNTER — Emergency Department (HOSPITAL_COMMUNITY)
Admission: EM | Admit: 2018-04-26 | Discharge: 2018-04-26 | Disposition: A | Discharge status: Home / Self Care | Payer: No Typology Code available for payment source | Attending: Emergency Medicine | Admitting: Emergency Medicine

## 2018-04-26 ENCOUNTER — Emergency Department (HOSPITAL_COMMUNITY): Payer: No Typology Code available for payment source

## 2018-04-26 ENCOUNTER — Other Ambulatory Visit: Payer: Self-pay

## 2018-04-26 ENCOUNTER — Encounter (HOSPITAL_COMMUNITY): Payer: Self-pay | Admitting: Emergency Medicine

## 2018-04-26 DIAGNOSIS — Z79899 Other long term (current) drug therapy: Secondary | ICD-10-CM | POA: Diagnosis not present

## 2018-04-26 DIAGNOSIS — F17210 Nicotine dependence, cigarettes, uncomplicated: Secondary | ICD-10-CM | POA: Diagnosis not present

## 2018-04-26 DIAGNOSIS — N3001 Acute cystitis with hematuria: Secondary | ICD-10-CM | POA: Insufficient documentation

## 2018-04-26 DIAGNOSIS — M5441 Lumbago with sciatica, right side: Secondary | ICD-10-CM | POA: Diagnosis not present

## 2018-04-26 DIAGNOSIS — M545 Low back pain: Secondary | ICD-10-CM | POA: Diagnosis present

## 2018-04-26 LAB — CBC WITH DIFFERENTIAL/PLATELET
Basophils Absolute: 0.1 10*3/uL (ref 0.0–0.1)
Basophils Relative: 1 %
Eosinophils Absolute: 0.3 10*3/uL (ref 0.0–0.7)
Eosinophils Relative: 3 %
HCT: 39.7 % (ref 36.0–46.0)
Hemoglobin: 13.2 g/dL (ref 12.0–15.0)
Lymphocytes Relative: 40 %
Lymphs Abs: 3.9 10*3/uL (ref 0.7–4.0)
MCH: 30 pg (ref 26.0–34.0)
MCHC: 33.2 g/dL (ref 30.0–36.0)
MCV: 90.2 fL (ref 78.0–100.0)
Monocytes Absolute: 0.6 10*3/uL (ref 0.1–1.0)
Monocytes Relative: 6 %
Neutro Abs: 5 10*3/uL (ref 1.7–7.7)
Neutrophils Relative %: 50 %
Platelets: 221 10*3/uL (ref 150–400)
RBC: 4.4 MIL/uL (ref 3.87–5.11)
RDW: 14.2 % (ref 11.5–15.5)
WBC: 9.7 10*3/uL (ref 4.0–10.5)

## 2018-04-26 LAB — COMPREHENSIVE METABOLIC PANEL
ALT: 22 U/L (ref 0–44)
AST: 19 U/L (ref 15–41)
Albumin: 3.8 g/dL (ref 3.5–5.0)
Alkaline Phosphatase: 47 U/L (ref 38–126)
Anion gap: 6 (ref 5–15)
BUN: 13 mg/dL (ref 6–20)
CO2: 25 mmol/L (ref 22–32)
Calcium: 8.7 mg/dL — ABNORMAL LOW (ref 8.9–10.3)
Chloride: 108 mmol/L (ref 98–111)
Creatinine, Ser: 0.67 mg/dL (ref 0.44–1.00)
GFR calc Af Amer: >60 mL/min (ref >60)
GFR calc non Af Amer: >60 mL/min (ref >60)
Glucose, Bld: 89 mg/dL (ref 70–99)
Potassium: 4 mmol/L (ref 3.5–5.1)
Sodium: 139 mmol/L (ref 135–145)
Total Bilirubin: 0.5 mg/dL (ref 0.3–1.2)
Total Protein: 7 g/dL (ref 6.5–8.1)

## 2018-04-26 LAB — URINALYSIS, ROUTINE W REFLEX MICROSCOPIC
Bilirubin Urine: NEGATIVE
Glucose, UA: NEGATIVE mg/dL
Ketones, ur: NEGATIVE mg/dL
Nitrite: NEGATIVE
PH: 5 (ref 5.0–8.0)
Protein, ur: NEGATIVE mg/dL
RBC / HPF: >50 RBC/hpf — ABNORMAL HIGH (ref 0–5)
SPECIFIC GRAVITY, URINE: 1.033 — AB (ref 1.005–1.030)

## 2018-04-26 MED ORDER — PREDNISONE 10 MG (21) PO TBPK: ORAL_TABLET | Freq: Every day | ORAL | 0 refills | Status: DC

## 2018-04-26 MED ORDER — CEPHALEXIN 500 MG PO CAPS: 500.0000 mg | ORAL_CAPSULE | Freq: Four times a day (QID) | ORAL | 0 refills | Status: AC

## 2018-04-26 MED ORDER — OXYCODONE-ACETAMINOPHEN 5-325 MG PO TABS
1.0000 | ORAL_TABLET | Freq: Once | ORAL | Status: AC
  Administered 2018-04-26: 1 via ORAL
  Filled 2018-04-26: qty 1

## 2018-04-26 MED ORDER — CYCLOBENZAPRINE HCL 10 MG PO TABS: 10.0000 mg | ORAL_TABLET | Freq: Two times a day (BID) | ORAL | 0 refills | Status: DC | PRN

## 2018-04-26 NOTE — ED Provider Notes (Signed)
[facility] EMERGENCY DEPARTMENT Provider Note   CSN: [id number] Arrival date & time: 04/26/18  4098     History   Chief Complaint Chief Complaint  Patient presents with  . Back Pain    HPI Mary Holland is a 47 y.o. female with history of chronic back pain, degenerative disc disease, narcolepsy presents for evaluation of acute onset, constant right-sided low back pain and right leg pain for the past 2 days or so.  Pain is constant, sharp and radiates down the right lower extreme knee.  Pain worsens with certain position changes, ambulation, and laying flat.  Pain improved somewhat sitting upright.  Patient has attempted ibuprofen and Tylenol without relief of her symptoms.  She denies abdominal pain, urinary symptoms, vaginal itching, bleeding, discharge, fevers, or chills.  She states this feels different from her usual back pain.  She denies saddle anesthesia, bowel or bladder incontinence, IV drug use, or history of cancer.  No recent trauma or falls.  The history is provided by the patient.    Past Medical History:  Diagnosis Date  . Chronic back pain   . DDD (degenerative disc disease), cervical   . Narcolepsy   . Ovarian cyst   . Sciatic pain     There are no active problems to display for this patient.   Past Surgical History:  Procedure Laterality Date  . ABDOMINAL HYSTERECTOMY    . CESAREAN SECTION    . KNEE SURGERY Right    arthroscopy  . partial hysterectomy    . removal of left ovary    . removal rt ovary       OB History   None      Home Medications    Prior to Admission medications   Medication Sig Start Date End Date Taking? Authorizing Provider  estradiol (ESTRACE) 0.5 MG tablet Take 2 mg by mouth daily.    Yes [provider]  cephALEXin (KEFLEX) 500 MG capsule Take 1 capsule (500 mg total) by mouth 4 (four) times daily for 7 days. 04/26/18 05/03/18  Michela Pitcher A, PA-C  cyclobenzaprine (FLEXERIL) 10 MG tablet Take 1 tablet (10 mg  total) by mouth 2 (two) times daily as needed for muscle spasms. 04/26/18   Glendene Wyer A, PA-C  predniSONE (STERAPRED UNI-PAK 21 TAB) 10 MG (21) TBPK tablet Take by mouth daily. Take 6 tabs by mouth daily  for 2 days, then 5 tabs for 2 days, then 4 tabs for 2 days, then 3 tabs for 2 days, 2 tabs for 2 days, then 1 tab by mouth daily for 2 days 04/26/18   Jeanie Sewer, PA-C    Family History History reviewed. No pertinent family history.  Social History Social History   Tobacco Use  . Smoking status: Current Every Day Smoker    Packs/day: 1.00    Years: 10.00    Pack years: 10.00    Types: Cigarettes  . Smokeless tobacco: Never Used  Substance Use Topics  . Alcohol use: Never    Frequency: Never  . Drug use: No     Allergies   Patient has no known allergies.   Review of Systems Review of Systems  Constitutional: Negative for chills and fever.  Respiratory: Negative for shortness of breath.   Cardiovascular: Negative for chest pain.  Gastrointestinal: Negative for abdominal pain, constipation, diarrhea, nausea and vomiting.  Genitourinary: Negative for vaginal bleeding, vaginal discharge and vaginal pain.  Musculoskeletal: Positive for back pain.  Neurological: Negative  for syncope, weakness and numbness.  All other systems reviewed and are negative.    Physical Exam Updated Vital Signs BP (!) 157/91 (BP Location: Right Arm)   Pulse 62   Temp 97.8 F (36.6 C) (Oral)   Resp 20   SpO2 100%   Physical Exam  Constitutional: She is oriented to person, place, and time. She appears well-developed and well-nourished. No distress.  HENT:  Head: Normocephalic and atraumatic.  Eyes: Conjunctivae are normal. Right eye exhibits no discharge. Left eye exhibits no discharge.  Neck: No JVD present. No tracheal deviation present.  Cardiovascular: Normal rate and intact distal pulses.  2+ radial and DP/PT pulses bilaterally, Homans sign absent bilaterally, no lower extremity  edema, no palpable cords, compartments are soft   Pulmonary/Chest: Effort normal.  Abdominal: Soft. Bowel sounds are normal. She exhibits no distension. There is no tenderness. There is no guarding.  Musculoskeletal: She exhibits tenderness. She exhibits no edema.  Diffuse midline lumbar spine tenderness from around L3-S1 with associated right sided paralumbar muscle tenderness and spasm.  Right SI joint tenderness noted.  No deformity, crepitus, or step-off noted.  5/5 strength of BLE major muscle groups.  Positive straight leg raise on the right.  Decreased range of motion of the lumbar spine with flexion extension secondary to pain.  Neurological: She is alert and oriented to person, place, and time. No sensory deficit. She exhibits normal muscle tone.  Fluent speech with no evidence of dysarthria or aphasia, no facial droop, sensation intact to soft touch of bilateral lower extreme knees.  Ambulates with mildly antalgic gait, exhibits good balance.  Able to Heel Walk and Toe Walk without difficulty  Skin: Skin is warm and dry. No erythema.  Psychiatric: She has a normal mood and affect. Her behavior is normal.  Nursing note and vitals reviewed.    ED Treatments / Results  Labs (all labs ordered are listed, but only abnormal results are displayed) Labs Reviewed  URINE CULTURE - Abnormal; Notable for the following components:      Result Value   Culture   (*)    Value: <10,000 COLONIES/mL INSIGNIFICANT GROWTH Performed at [facility], [address], [place], [place] [address]    All other components within normal limits  URINALYSIS, ROUTINE W REFLEX MICROSCOPIC - Abnormal; Notable for the following components:   APPearance HAZY (*)    Specific Gravity, Urine 1.033 (*)    Hgb urine dipstick MODERATE (*)    Leukocytes, UA TRACE (*)    RBC / HPF >50 (*)    Bacteria, UA RARE (*)    All other components within normal limits  COMPREHENSIVE METABOLIC PANEL - Abnormal; Notable for  the following components:   Calcium 8.7 (*)    All other components within normal limits  CBC WITH DIFFERENTIAL/PLATELET    EKG None  Radiology Ct Renal Stone Study  Result Date: 04/26/2018 CLINICAL DATA:  Right low back pain for 2 days radiating down the right leg at times. Hematuria. EXAM: CT ABDOMEN AND PELVIS WITHOUT CONTRAST TECHNIQUE: Multidetector CT imaging of the abdomen and pelvis was performed following the standard protocol without IV contrast. COMPARISON:  04/02/2012 CT abdomen and pelvis. 05/07/2015 pelvic ultrasound. FINDINGS: Lower chest: Clear lung bases. Hepatobiliary: Scattered subcentimeter low-density liver lesions, too small to fully characterize. Unremarkable gallbladder. No biliary dilatation. Pancreas: Unremarkable. Spleen: Unremarkable. Adrenals/Urinary Tract: Unremarkable adrenal glands. No evidence of renal mass, urolithiasis, or hydroureteronephrosis. Decompressed bladder. Stomach/Bowel: The stomach is within normal  limits. There is no evidence of bowel obstruction or inflammation. The appendix is unremarkable. Vascular/Lymphatic: Normal caliber of the abdominal aorta. No enlarged lymph nodes. Reproductive: History of hysterectomy and bilateral oophorectomy. Other: No intraperitoneal free fluid.  No abdominal wall hernia. Musculoskeletal: L5-S1 disc and facet degeneration with disc bulging, spurring, and disc space height loss contributing to mild bilateral neural foraminal stenosis. IMPRESSION: No acute abnormality identified in the abdomen or pelvis. No evidence of urinary tract calculi or obstruction. Electronically Signed   By: Sebastian AcheAllen  Grady M.D.   On: 04/26/2018 12:09    Procedures Procedures (including critical care time)  Medications Ordered in ED Medications  oxyCODONE-acetaminophen (PERCOCET/ROXICET) 5-325 MG per tablet 1 tablet (1 tablet Oral Given 04/26/18 1124)     Initial Impression / Assessment and Plan / ED Course  I have reviewed the triage vital  signs and the nursing notes.  Pertinent labs & imaging results that were available during my care of the patient were reviewed by me and considered in my medical decision making (see chart for details).     Patient presents for evaluation of right-sided low back and right lower extremity pain for the past 2 days or so.  She is afebrile, hypertensive while in the ED with some improvement on reevaluation.  She does appear to be in a fair amount of pain which may be contributing to her hypertension but I did recommend follow-up with her PCP for reevaluation of this and possible medication management.  She has no red flag signs concerning for cauda equina or spinal abscess.  She is ambulatory without difficulty despite pain.  She has no urinary symptoms however UA is concerning for UTI versus nephrolithiasis with trace leuk esterase, bacteria and greater than 50 RBCs.  Will obtain lab work and CT renal stone study to rule out nephrolithiasis versus pyelonephritis given back pain.  Lab work reviewed by me shows no leukocytosis, no electro light abnormalities.  LFTs and creatinine within normal limits.  CT renal stone study does show L5/S1 disc and facet degeneration with disc bulging, spurring, and disc space height loss contributing to bilateral neuroforaminal stenosis but shows no acute abnormality in the abdomen and pelvis.  No evidence of pyelonephritis or nephrolithiasis.  Patient is a current smoker, painless hematuria needs to be reevaluated by her PCP which I informed her of.  She would like to move forward with empiric treatment for UTI at this time which I think is reasonable.  We will also discharge with prednisone taper and Flexeril for back pain.  Advised of appropriate use of these medications.  Recommend follow-up with PCP for reevaluation of hypertension, hematuria, and neurosurgery or PCP for further management of her back pain.  Discussed strict ED return precautions. Pt verbalized understanding  of and agreement with plan and is safe for discharge home at this time.   Final Clinical Impressions(s) / ED Diagnoses   Final diagnoses:  Acute right-sided low back pain with right-sided sciatica  Acute cystitis with hematuria    ED Discharge Orders        Ordered    cephALEXin (KEFLEX) 500 MG capsule  4 times daily     04/26/18 1248    cyclobenzaprine (FLEXERIL) 10 MG tablet  2 times daily PRN     04/26/18 1248    predniSONE (STERAPRED UNI-PAK 21 TAB) 10 MG (21) TBPK tablet  Daily     04/26/18 1248       Tyriq Moragne, MaribelMina A, PA-C 04/28/18 1006  Loren Racer, MD 04/29/18 [phone]

## 2018-04-26 NOTE — ED Triage Notes (Signed)
Pt c/o right lower back pain x 2 days radiating down right leg at times. Hx of "bone on bone" to lower back per pt. Denies gu sx.

## 2018-04-26 NOTE — Discharge Instructions (Signed)
1. Medications: Please take all of your antibiotics until finished!   You may develop abdominal discomfort or diarrhea from the antibiotic.  You may help offset this with probiotics which you can buy or get in yogurt. Do not eat  or take the probiotics until 2 hours after your antibiotic.   Take prednisone taper as prescribed but do not take ibuprofen, Advil, Aleve, or Motrin while you are taking this medicine.  You may take [phone] mg of Tylenol every 6 hours as needed for pain in addition to steroid pack. Do not exceed 4000 mg of Tylenol daily.  Take steroid with food to avoid upset stomach issues.    You can take Flexeril as needed for muscle spasm up to twice daily but do not drive, drink alcohol, or operate heavy machinery while taking this medicine because it may make you drowsy.  I typically recommend taking this medicine only at night when you are going to sleep.  You  can also cut these tablets in half if they make you feel very drowsy.  2. Treatment: rest, drink plenty of fluids, gentle stretching as discussed (see attached), alternate ice and heat (or stick with whichever feels best) 20 minutes on 20 minutes off. 3. Follow Up: Please followup with your primary doctor in 3-7 days for discussion of your diagnoses and further evaluation after today's visit; also have them repeat a urine test to make sure that the blood in your urine has cleared up; follow-up with neurosurgery for discussion of possible surgery or physical therapy for your back pain.  Return to the ER for worsening back pain, difficulty walking, loss of bowel or bladder control or other concerning symptoms

## 2018-04-27 LAB — URINE CULTURE: Culture: <10000 — AB

## 2018-06-19 ENCOUNTER — Other Ambulatory Visit: Payer: Self-pay | Admitting: Obstetrics & Gynecology

## 2019-04-23 ENCOUNTER — Other Ambulatory Visit: Payer: No Typology Code available for payment source

## 2019-04-23 DIAGNOSIS — Z20822 Contact with and (suspected) exposure to covid-19: Secondary | ICD-10-CM

## 2019-04-25 LAB — NOVEL CORONAVIRUS, NAA: SARS-CoV-2, NAA: NOT DETECTED

## 2019-05-01 ENCOUNTER — Emergency Department (HOSPITAL_COMMUNITY)
Admission: EM | Admit: 2019-05-01 | Discharge: 2019-05-01 | Disposition: A | Discharge status: Home / Self Care | Payer: No Typology Code available for payment source | Attending: Emergency Medicine | Admitting: Emergency Medicine

## 2019-05-01 ENCOUNTER — Emergency Department (HOSPITAL_COMMUNITY): Payer: No Typology Code available for payment source

## 2019-05-01 ENCOUNTER — Other Ambulatory Visit: Payer: Self-pay

## 2019-05-01 ENCOUNTER — Encounter (HOSPITAL_COMMUNITY): Payer: Self-pay | Admitting: *Deleted

## 2019-05-01 DIAGNOSIS — R0789 Other chest pain: Secondary | ICD-10-CM

## 2019-05-01 DIAGNOSIS — F17210 Nicotine dependence, cigarettes, uncomplicated: Secondary | ICD-10-CM | POA: Diagnosis not present

## 2019-05-01 DIAGNOSIS — M546 Pain in thoracic spine: Secondary | ICD-10-CM | POA: Insufficient documentation

## 2019-05-01 LAB — BASIC METABOLIC PANEL
Anion gap: 7 (ref 5–15)
BUN: 16 mg/dL (ref 6–20)
CO2: 25 mmol/L (ref 22–32)
Calcium: 9 mg/dL (ref 8.9–10.3)
Chloride: 106 mmol/L (ref 98–111)
Creatinine, Ser: 1.28 mg/dL — ABNORMAL HIGH (ref 0.44–1.00)
GFR calc Af Amer: 58 mL/min — ABNORMAL LOW (ref >60)
GFR calc non Af Amer: 50 mL/min — ABNORMAL LOW (ref >60)
Glucose, Bld: 112 mg/dL — ABNORMAL HIGH (ref 70–99)
Potassium: 4.1 mmol/L (ref 3.5–5.1)
Sodium: 138 mmol/L (ref 135–145)

## 2019-05-01 LAB — CBC
HCT: 44.4 % (ref 36.0–46.0)
Hemoglobin: 14.3 g/dL (ref 12.0–15.0)
MCH: 29.7 pg (ref 26.0–34.0)
MCHC: 32.2 g/dL (ref 30.0–36.0)
MCV: 92.1 fL (ref 80.0–100.0)
Platelets: 207 10*3/uL (ref 150–400)
RBC: 4.82 MIL/uL (ref 3.87–5.11)
RDW: 14.2 % (ref 11.5–15.5)
WBC: 9.9 10*3/uL (ref 4.0–10.5)
nRBC: 0 % (ref 0.0–0.2)

## 2019-05-01 LAB — TROPONIN I (HIGH SENSITIVITY)
Troponin I (High Sensitivity): <2 ng/L (ref <18)
Troponin I (High Sensitivity): <2 ng/L (ref <18)

## 2019-05-01 MED ORDER — MELOXICAM 15 MG PO TABS: 15.0000 mg | ORAL_TABLET | Freq: Every day | ORAL | 0 refills | Status: DC

## 2019-05-01 NOTE — ED Triage Notes (Signed)
Pt c/o pain that starts in the right side of her back and radiates to her mid chest. Pt reports the pain started 1 week ago but worsened today.

## 2019-05-03 NOTE — ED Provider Notes (Signed)
[facility] EMERGENCY DEPARTMENT Provider Note   CSN: [id number] Arrival date & time: 05/01/19  1632     History   Chief Complaint Chief Complaint  Patient presents with  . Chest Pain    HPI Mary Holland is a 48 y.o. female.     HPI   48 year old female with right back pain and right chest pain.  Onset about a week ago.  Worse with certain movements.  The pain radiates from her back into her axilla around the area of her right breast.  Denies any acute trauma or strain.  No respiratory complaints.  No numbness or tingling.  No fevers or chills.  Past Medical History:  Diagnosis Date  . Chronic back pain   . DDD (degenerative disc disease), cervical   . Narcolepsy   . Ovarian cyst   . Sciatic pain     There are no active problems to display for this patient.   Past Surgical History:  Procedure Laterality Date  . ABDOMINAL HYSTERECTOMY    . CESAREAN SECTION    . KNEE SURGERY Right    arthroscopy  . partial hysterectomy    . removal of left ovary    . removal rt ovary       OB History   No obstetric history on file.      Home Medications    Prior to Admission medications   Medication Sig Start Date End Date Taking? Authorizing Provider  estradiol (ESTRACE) 2 MG tablet TAKE ONE TABLET BY MOUTH ONCE DAILY. Patient taking differently: Take 2 mg by mouth daily.  06/19/18  Yes Lazaro ArmsEure, Luther H, MD  ibuprofen (ADVIL) 200 MG tablet Take 200 mg by mouth every 6 (six) hours as needed for mild pain or moderate pain.   Yes [provider]  meloxicam (MOBIC) 15 MG tablet Take 1 tablet (15 mg total) by mouth daily. 05/01/19   Raeford RazorKohut, Alyn Jurney, MD    Family History No family history on file.  Social History Social History   Tobacco Use  . Smoking status: Current Every Day Smoker    Packs/day: 1.00    Years: 10.00    Pack years: 10.00    Types: Cigarettes  . Smokeless tobacco: Never Used  Substance Use Topics  . Alcohol use: Yes    Frequency: Never     Comment: occassionally   . Drug use: No     Allergies   Patient has no known allergies.   Review of Systems Review of Systems  All systems reviewed and negative, other than as noted in HPI. Physical Exam Updated Vital Signs BP 136/86   Pulse 72   Temp 98.6 F (37 C) (Oral)   Resp (!) 21   Ht 5\' 4"  (1.626 m)   Wt 79.4 kg   SpO2 100%   BMI 30.04 kg/m   Physical Exam Vitals signs and nursing note reviewed.  Constitutional:      General: She is not in acute distress.    Appearance: She is well-developed.  HENT:     Head: Normocephalic and atraumatic.  Eyes:     General:        Right eye: No discharge.        Left eye: No discharge.     Conjunctiva/sclera: Conjunctivae normal.  Neck:     Musculoskeletal: Neck supple.  Cardiovascular:     Rate and Rhythm: Normal rate and regular rhythm.     Heart sounds: Normal heart sounds. No murmur. No  friction rub. No gallop.   Pulmonary:     Effort: Pulmonary effort is normal. No respiratory distress.     Breath sounds: Normal breath sounds.  Abdominal:     General: There is no distension.     Palpations: Abdomen is soft.     Tenderness: There is no abdominal tenderness.  Musculoskeletal:        General: No tenderness.     Comments: Tenderness to palpation in the right mid thoracic back.  No overlying skin changes.  No midline spinal tenderness.  No overlying skin changes.  Skin:    General: Skin is warm and dry.  Neurological:     Mental Status: She is alert.  Psychiatric:        Behavior: Behavior normal.        Thought Content: Thought content normal.      ED Treatments / Results  Labs (all labs ordered are listed, but only abnormal results are displayed) Labs Reviewed  BASIC METABOLIC PANEL - Abnormal; Notable for the following components:      Result Value   Glucose, Bld 112 (*)    Creatinine, Ser 1.28 (*)    GFR calc non Af Amer 50 (*)    GFR calc Af Amer 58 (*)    All other components within normal  limits  CBC  TROPONIN I (HIGH SENSITIVITY)  TROPONIN I (HIGH SENSITIVITY)    EKG EKG Interpretation  Date/Time:  Tuesday May 01 2019 16:42:02 EDT Ventricular Rate:  62 PR Interval:  150 QRS Duration: 78 QT Interval:  452 QTC Calculation: 458 R Axis:   67 Text Interpretation:  Normal sinus rhythm Normal ECG Confirmed by Virgel Manifold [phone]) on 05/01/2019 9:33:22 PM   Radiology Dg Chest 2 View  Result Date: 05/01/2019 CLINICAL DATA:  Chest pain and shortness of breath EXAM: CHEST - 2 VIEW COMPARISON:  August 29, 2017 FINDINGS: Lungs are clear. Heart size and pulmonary vascularity are normal. No adenopathy. There is mild degenerative change in the thoracic spine. No pneumothorax. IMPRESSION: No edema or consolidation. Electronically Signed   By: Lowella Grip III M.D.   On: 05/01/2019 17:34    Procedures Procedures (including critical care time)  Medications Ordered in ED Medications - No data to display   Initial Impression / Assessment and Plan / ED Course  I have reviewed the triage vital signs and the nursing notes.  Pertinent labs & imaging results that were available during my care of the patient were reviewed by me and considered in my medical decision making (see chart for details).        48 year old female with right backslash chest pain.  I think this is musculoskeletal or possibly a thoracic radiculopathy.  Very low suspicion for ACS, PE, dissection of the emergent process.  There is no trauma.  Pain is been pretty constant for about a week.  Worse with certain movements/positions.  Does seem almost dermatomal.  There are no skin changes to suggest shingles.  Plan symptomatic treatment at this time.  Return precautions were discussed.  Outpatient follow-up for persistent symptoms otherwise.  Final Clinical Impressions(s) / ED Diagnoses   Final diagnoses:  Acute right-sided thoracic back pain  Right-sided chest wall pain    ED Discharge Orders          Ordered    meloxicam (MOBIC) 15 MG tablet  Daily     05/01/19 2147           Virgel Manifold, MD 05/03/19  1039  

## 2019-06-18 ENCOUNTER — Encounter: Payer: Self-pay | Admitting: Orthopedic Surgery

## 2019-06-18 ENCOUNTER — Ambulatory Visit: Payer: No Typology Code available for payment source

## 2019-06-18 ENCOUNTER — Ambulatory Visit (INDEPENDENT_AMBULATORY_CARE_PROVIDER_SITE_OTHER): Payer: No Typology Code available for payment source | Admitting: Orthopedic Surgery

## 2019-06-18 ENCOUNTER — Other Ambulatory Visit: Payer: Self-pay

## 2019-06-18 VITALS — BP 119/85 | HR 72 | Ht 64.0 in | Wt 167.0 lb

## 2019-06-18 DIAGNOSIS — M25561 Pain in right knee: Secondary | ICD-10-CM

## 2019-06-18 DIAGNOSIS — G8929 Other chronic pain: Secondary | ICD-10-CM

## 2019-06-18 DIAGNOSIS — M5441 Lumbago with sciatica, right side: Secondary | ICD-10-CM | POA: Diagnosis not present

## 2019-06-18 MED ORDER — PREDNISONE 10 MG PO TABS: 10.0000 mg | ORAL_TABLET | Freq: Every day | ORAL | 0 refills | Status: DC

## 2019-06-18 NOTE — Patient Instructions (Signed)

## 2019-06-18 NOTE — Progress Notes (Signed)
Mary Holland  06/18/2019  HISTORY SECTION :  Chief Complaint  Patient presents with  . Knee Pain    both knees painful/ swelling for a few months    The patient presents for evaluation of pain and swelling both knees worse right knee status post arthroscopy right knee back in 2007  Pain and swelling is located in both knees right greater than left the left one is gone down the right is still swollen.  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    Review of Systems  Musculoskeletal:       Chronic back pain dating back to 2002 had an MRI which showed some disc protrusions has back pain 5 out of 7 days with dull aching without leg pain  Neurological: Negative for sensory change and weakness.       Notes occasionally right leg giving way  All other systems reviewed and are negative.    Past Medical History:  Diagnosis Date  . Chronic back pain   . DDD (degenerative disc disease), cervical   . Narcolepsy   . Ovarian cyst   . Sciatic pain     Past Surgical History:  Procedure Laterality Date  . ABDOMINAL HYSTERECTOMY    . CESAREAN SECTION    . KNEE SURGERY Right    arthroscopy  . partial hysterectomy    . removal of left ovary    . removal rt ovary       No Known Allergies   Current Outpatient Medications:  .  estradiol (ESTRACE) 2 MG tablet, TAKE ONE TABLET BY MOUTH ONCE DAILY. (Patient taking differently: Take 2 mg by mouth daily. ), Disp: 30 tablet, Rfl: 11 .  ibuprofen (ADVIL) 200 MG tablet, Take 200 mg by mouth every 6 (six) hours as needed for mild pain or moderate pain., Disp: , Rfl:  .  meloxicam (MOBIC) 15 MG tablet, Take 1 tablet (15 mg total) by mouth daily. (Patient not taking: Reported on 06/18/2019), Disp: 10 tablet, Rfl: 0   PHYSICAL EXAM SECTION: 1) BP 119/85    Pulse 72   Ht 5\' 4"  (1.626 m)   Wt 167 lb (75.8 kg)   BMI 28.67 kg/m   Body mass index is 28.67 kg/m. General appearance: Well-developed well-nourished no gross deformities  2) Cardiovascular normal pulse and perfusion in the lower extremities normal color without edema  3) Neurologically deep tendon reflexes are equal and normal, no sensation loss or deficits no pathologic reflexes  4) Psychological: Awake alert and oriented x3 mood and affect normal  5) Skin no lacerations or ulcerations no nodularity no palpable masses, no erythema or nodularity  6) Musculoskeletal:   She has normal posture in the lower back she has no skin lesions to suggest congenital abnormality She has tenderness in the lower back on both right left side and middle with negative straight leg raises bilaterally  Left knee no tenderness normal range of motion no instability normal muscle tone ligaments are stable McMurray sign is negative  Right knee lateral joint line is nontender medial joint line is tender she has swelling and tenderness around the knee joint with tenderness in the peripatellar region  Range of motion is 125 degrees of full flexion ligaments are stable muscle tone and strength are normal McMurray sign  is negative     MEDICAL DECISION SECTION:  Encounter Diagnoses  Name Primary?  . Chronic midline low back pain with right-sided sciatica Yes  . Chronic pain of right knee     Imaging X-rays lumbar spine show degenerative disc disease at L5-S1 with some facet arthritis spondylosis and scoliosis see report  Right knee x-ray shows mild degeneration of the lateral compartment with osteophytes around the joint see report  Plan:  (Rx., Inj., surg., Frx, MRI/CT, XR:2) Recommend injection  Recommend oral anti-inflammatory for 6 weeks  Recommend home exercise lumbar spine  Follow-up 6 weeks consider conversion to Mobic or diclofenac for the knee arthritis  Procedure note right knee  injection   verbal consent was obtained to inject right knee joint  Timeout was completed to confirm the site of injection  The medications used were 40 mg of Depo-Medrol and 1% lidocaine 3 cc  Anesthesia was provided by ethyl chloride and the skin was prepped with alcohol.  After cleaning the skin with alcohol a 20-gauge needle was used to inject the right knee joint. There were no complications. A sterile bandage was applied.    9:44 AM Arther Abbott, MD  06/18/2019

## 2019-07-18 ENCOUNTER — Other Ambulatory Visit: Payer: Self-pay | Admitting: Obstetrics & Gynecology

## 2019-07-30 ENCOUNTER — Encounter: Payer: Self-pay | Admitting: Orthopedic Surgery

## 2019-07-30 ENCOUNTER — Ambulatory Visit (INDEPENDENT_AMBULATORY_CARE_PROVIDER_SITE_OTHER): Payer: No Typology Code available for payment source | Admitting: Orthopedic Surgery

## 2019-07-30 ENCOUNTER — Other Ambulatory Visit: Payer: Self-pay

## 2019-07-30 VITALS — BP 126/79 | HR 69 | Ht 64.0 in | Wt 170.0 lb

## 2019-07-30 DIAGNOSIS — G8929 Other chronic pain: Secondary | ICD-10-CM

## 2019-07-30 DIAGNOSIS — M25561 Pain in right knee: Secondary | ICD-10-CM | POA: Diagnosis not present

## 2019-07-30 DIAGNOSIS — M5441 Lumbago with sciatica, right side: Secondary | ICD-10-CM | POA: Diagnosis not present

## 2019-07-30 MED ORDER — GABAPENTIN 100 MG PO CAPS: 100.0000 mg | ORAL_CAPSULE | Freq: Three times a day (TID) | ORAL | 2 refills | Status: DC

## 2019-07-30 MED ORDER — DICLOFENAC SODIUM 75 MG PO TBEC: 75.0000 mg | DELAYED_RELEASE_TABLET | Freq: Two times a day (BID) | ORAL | 2 refills | Status: DC

## 2019-07-30 NOTE — Patient Instructions (Signed)
Stop deltasone and advil   Start gabapentin and diclofenac   We will obtain pre-certification from the insurer and call you to schedule the study. Dr Aline Brochure will call you with the results

## 2019-07-30 NOTE — Progress Notes (Signed)
Chief Complaint  Patient presents with  . Knee Pain    right/ decreased swelling   . Back Pain    48 year old female presented last month with bilateral knee pain right greater than left with right knee swelling and chronic lower back pain status post MRI in 2002  Her knee was injected she was put on anti-inflammatories (Advil, Mobic, Deltasone) and home exercises for the lumbar spine  She returns with decreased pain in the right knee although still has an effusion  Her back pain is not improved and she still has right leg pain   Past Medical History:  Diagnosis Date  . Chronic back pain   . DDD (degenerative disc disease), cervical   . Narcolepsy   . Ovarian cyst   . Sciatic pain     Review of Systems  Musculoskeletal:       Chronic back pain dating back to 2002 had an MRI which showed some disc protrusions has back pain 5 out of 7 days with dull aching without leg pain  Neurological: Negative for sensory change and weakness.       Notes occasionally right leg giving way  All other systems reviewed and are negative.     Current Outpatient Medications:  .  diclofenac (VOLTAREN) 75 MG EC tablet, Take 1 tablet (75 mg total) by mouth 2 (two) times daily with a meal., Disp: 60 tablet, Rfl: 2 .  estradiol (ESTRACE) 2 MG tablet, TAKE ONE TABLET BY MOUTH ONCE DAILY., Disp: 30 tablet, Rfl: 11 .  gabapentin (NEURONTIN) 100 MG capsule, Take 1 capsule (100 mg total) by mouth 3 (three) times daily., Disp: 90 capsule, Rfl: 2 .  ibuprofen (ADVIL) 200 MG tablet, Take 200 mg by mouth every 6 (six) hours as needed for mild pain or moderate pain., Disp: , Rfl:  .  meloxicam (MOBIC) 15 MG tablet, Take 1 tablet (15 mg total) by mouth daily. (Patient not taking: Reported on 06/18/2019), Disp: 10 tablet, Rfl: 0 .  predniSONE (DELTASONE) 10 MG tablet, Take 1 tablet (10 mg total) by mouth daily., Disp: 60 tablet, Rfl: 0  BP 126/79   Pulse 69   Ht 5\' 4"  (1.626 m)   Wt 170 lb (77.1 kg)   BMI  29.18 kg/m   Physical Exam Vitals signs and nursing note reviewed.  Constitutional:      Appearance: Normal appearance.  Neurological:     Mental Status: She is alert and oriented to person, place, and time.  Psychiatric:        Mood and Affect: Mood normal.    Lumbar TENDERNESS lower back ROM decreased range of motion INSTABILITY none MOTOR normal SKIN normal  Knee right effusion noted with tenderness in the peripatellar region with crepitance ligaments appear stable motor exam is normal skin is intact   CDV normal in each leg SENSATION no sensory deficits but painful straight leg raise at 45 degrees LYMPH groin nodes are negative bilaterally  MEDICAL DECISIONS  Data:   X-rays-lumbar spine x-ray done on September 14: Facet arthritis L4 and 5 L5 and 1 impression mild spondylosis  X-rays of her right knee status post knee arthroscopy 2007 site 1 to 2 degrees excessive valgus degenerative changes lateral compartment small osteophytes peaking of the tibial spines mild arthritis noted    Encounter Diagnoses  Name Primary?  . Chronic midline low back pain with right-sided sciatica Yes  . Chronic pain of right knee     Plan:   Recommend MRI lumbar  spine and MRI right knee  Switch patient to new anti-inflammatory and gabapentin  Meds ordered this encounter  Medications  . diclofenac (VOLTAREN) 75 MG EC tablet    Sig: Take 1 tablet (75 mg total) by mouth 2 (two) times daily with a meal.    Dispense:  60 tablet    Refill:  2  . gabapentin (NEURONTIN) 100 MG capsule    Sig: Take 1 capsule (100 mg total) by mouth 3 (three) times daily.    Dispense:  90 capsule    Refill:  2

## 2019-08-10 ENCOUNTER — Ambulatory Visit (HOSPITAL_COMMUNITY)
Admission: RE | Admit: 2019-08-10 | Discharge: 2019-08-10 | Disposition: A | Discharge status: Home / Self Care | Payer: PRIVATE HEALTH INSURANCE | Source: Ambulatory Visit | Attending: Orthopedic Surgery | Admitting: Orthopedic Surgery

## 2019-08-10 ENCOUNTER — Other Ambulatory Visit: Payer: Self-pay

## 2019-08-10 DIAGNOSIS — M25561 Pain in right knee: Secondary | ICD-10-CM | POA: Insufficient documentation

## 2019-08-10 DIAGNOSIS — M5441 Lumbago with sciatica, right side: Secondary | ICD-10-CM | POA: Insufficient documentation

## 2019-08-10 DIAGNOSIS — G8929 Other chronic pain: Secondary | ICD-10-CM | POA: Insufficient documentation

## 2019-08-14 ENCOUNTER — Telehealth: Payer: Self-pay | Admitting: Radiology

## 2019-08-14 DIAGNOSIS — G8929 Other chronic pain: Secondary | ICD-10-CM

## 2019-08-14 NOTE — Telephone Encounter (Signed)
Put in orders for the [facility] left message for [facility] regarding referral

## 2019-08-14 NOTE — Telephone Encounter (Signed)
-----   Message from Carole Civil, MD sent at 08/14/2019  3:45 PM EST ----- Called her gave results   rec esi at [place]

## 2019-08-15 ENCOUNTER — Other Ambulatory Visit: Payer: Self-pay | Admitting: Orthopedic Surgery

## 2019-08-15 DIAGNOSIS — G8929 Other chronic pain: Secondary | ICD-10-CM

## 2019-08-21 ENCOUNTER — Other Ambulatory Visit: Payer: Self-pay

## 2019-08-21 ENCOUNTER — Ambulatory Visit
Admission: RE | Admit: 2019-08-21 | Discharge: 2019-08-21 | Disposition: A | Discharge status: Home / Self Care | Payer: No Typology Code available for payment source | Source: Ambulatory Visit | Attending: Orthopedic Surgery | Admitting: Orthopedic Surgery

## 2019-08-21 DIAGNOSIS — G8929 Other chronic pain: Secondary | ICD-10-CM

## 2019-08-21 MED ORDER — METHYLPREDNISOLONE ACETATE 40 MG/ML INJ SUSP (RADIOLOG
120.0000 mg | Freq: Once | INTRAMUSCULAR | Status: AC
  Administered 2019-08-21: 120 mg via EPIDURAL

## 2019-08-21 MED ORDER — IOPAMIDOL (ISOVUE-M 200) INJECTION 41%
1.0000 mL | Freq: Once | INTRAMUSCULAR | Status: AC
  Administered 2019-08-21: 1 mL via EPIDURAL

## 2019-08-21 NOTE — Discharge Instructions (Signed)

## 2019-09-03 ENCOUNTER — Other Ambulatory Visit: Payer: Self-pay | Admitting: Orthopedic Surgery

## 2019-09-03 ENCOUNTER — Telehealth: Payer: Self-pay

## 2019-09-03 ENCOUNTER — Telehealth: Payer: Self-pay | Admitting: *Deleted

## 2019-09-03 DIAGNOSIS — G8929 Other chronic pain: Secondary | ICD-10-CM

## 2019-09-03 DIAGNOSIS — M5441 Lumbago with sciatica, right side: Secondary | ICD-10-CM

## 2019-09-03 DIAGNOSIS — M545 Low back pain, unspecified: Secondary | ICD-10-CM

## 2019-09-03 MED ORDER — IBUPROFEN 800 MG PO TABS: 800.0000 mg | ORAL_TABLET | Freq: Three times a day (TID) | ORAL | 1 refills | Status: DC | PRN

## 2019-09-03 NOTE — Telephone Encounter (Signed)
Patient left message on voicemail stating that she went to [facility] for her EPI #1 and the next one is at [place]? She wants to know if this is the same injection and why one at [place] and the other at [place].  Please call and advise

## 2019-09-03 NOTE — Telephone Encounter (Signed)
May, Wendy, RT  Ailene Rud, [place]; Sherre Scarlet, RT        Patient wants to go back to [facility], where she had injections previously. Please cancel her upcoming appt with you all. Thanks.

## 2019-09-03 NOTE — Telephone Encounter (Signed)
I am not sure. I sent to [facility], does she need another one? She states she would like to go back to same place if possible.    She is also asking for pain meds  She is using the Gabapentin and Diclofenac, but not helping much.

## 2019-09-03 NOTE — Telephone Encounter (Signed)
I will change to ibuprofen

## 2019-09-06 ENCOUNTER — Ambulatory Visit
Admission: RE | Admit: 2019-09-06 | Discharge: 2019-09-06 | Disposition: A | Discharge status: Home / Self Care | Payer: No Typology Code available for payment source | Source: Ambulatory Visit | Attending: Orthopedic Surgery | Admitting: Orthopedic Surgery

## 2019-09-06 DIAGNOSIS — G8929 Other chronic pain: Secondary | ICD-10-CM

## 2019-09-06 MED ORDER — IOPAMIDOL (ISOVUE-M 200) INJECTION 41%
1.0000 mL | Freq: Once | INTRAMUSCULAR | Status: AC
  Administered 2019-09-06: 10:00:00 1 mL via EPIDURAL

## 2019-09-06 MED ORDER — METHYLPREDNISOLONE ACETATE 40 MG/ML INJ SUSP (RADIOLOG
120.0000 mg | Freq: Once | INTRAMUSCULAR | Status: AC
  Administered 2019-09-06: 10:00:00 120 mg via EPIDURAL

## 2019-09-06 NOTE — Discharge Instructions (Signed)

## 2019-09-20 ENCOUNTER — Encounter: Payer: No Typology Code available for payment source | Admitting: Physical Medicine and Rehabilitation

## 2019-10-24 ENCOUNTER — Other Ambulatory Visit: Payer: Self-pay | Admitting: Orthopedic Surgery

## 2019-10-24 DIAGNOSIS — M545 Low back pain, unspecified: Secondary | ICD-10-CM

## 2019-10-24 DIAGNOSIS — G8929 Other chronic pain: Secondary | ICD-10-CM

## 2019-10-26 ENCOUNTER — Ambulatory Visit
Admission: RE | Admit: 2019-10-26 | Discharge: 2019-10-26 | Disposition: A | Discharge status: Home / Self Care | Payer: No Typology Code available for payment source | Source: Ambulatory Visit | Attending: Orthopedic Surgery | Admitting: Orthopedic Surgery

## 2019-10-26 ENCOUNTER — Other Ambulatory Visit: Payer: Self-pay | Admitting: Orthopedic Surgery

## 2019-10-26 ENCOUNTER — Other Ambulatory Visit: Payer: Self-pay

## 2019-10-26 DIAGNOSIS — M545 Low back pain, unspecified: Secondary | ICD-10-CM

## 2019-10-26 DIAGNOSIS — G8929 Other chronic pain: Secondary | ICD-10-CM

## 2019-10-26 MED ORDER — METHYLPREDNISOLONE ACETATE 40 MG/ML INJ SUSP (RADIOLOG: 120.0000 mg | Freq: Once | INTRAMUSCULAR | Status: DC

## 2019-10-26 MED ORDER — IOPAMIDOL (ISOVUE-M 200) INJECTION 41%: 1.0000 mL | Freq: Once | INTRAMUSCULAR | Status: DC

## 2019-10-31 ENCOUNTER — Ambulatory Visit
Admission: RE | Admit: 2019-10-31 | Discharge: 2019-10-31 | Disposition: A | Discharge status: Home / Self Care | Payer: No Typology Code available for payment source | Source: Ambulatory Visit | Attending: Orthopedic Surgery | Admitting: Orthopedic Surgery

## 2019-10-31 ENCOUNTER — Other Ambulatory Visit: Payer: Self-pay

## 2019-10-31 DIAGNOSIS — G8929 Other chronic pain: Secondary | ICD-10-CM

## 2019-10-31 DIAGNOSIS — M545 Low back pain, unspecified: Secondary | ICD-10-CM

## 2019-10-31 MED ORDER — IOPAMIDOL (ISOVUE-M 200) INJECTION 41%
1.0000 mL | Freq: Once | INTRAMUSCULAR | Status: AC
  Administered 2019-10-31: 09:00:00 1 mL via EPIDURAL

## 2019-10-31 MED ORDER — METHYLPREDNISOLONE ACETATE 40 MG/ML INJ SUSP (RADIOLOG
120.0000 mg | Freq: Once | INTRAMUSCULAR | Status: AC
  Administered 2019-10-31: 09:00:00 120 mg via EPIDURAL

## 2019-10-31 NOTE — Discharge Instructions (Signed)

## 2019-11-12 ENCOUNTER — Other Ambulatory Visit: Payer: Self-pay

## 2019-11-12 ENCOUNTER — Ambulatory Visit (INDEPENDENT_AMBULATORY_CARE_PROVIDER_SITE_OTHER): Payer: No Typology Code available for payment source | Admitting: Orthopedic Surgery

## 2019-11-12 VITALS — BP 116/74 | HR 69 | Temp 97.9°F | Ht 64.0 in | Wt 170.0 lb

## 2019-11-12 DIAGNOSIS — M5441 Lumbago with sciatica, right side: Secondary | ICD-10-CM

## 2019-11-12 DIAGNOSIS — G8929 Other chronic pain: Secondary | ICD-10-CM

## 2019-11-12 MED ORDER — GABAPENTIN 300 MG PO CAPS: 300.0000 mg | ORAL_CAPSULE | Freq: Three times a day (TID) | ORAL | 5 refills | Status: DC

## 2019-11-12 NOTE — Progress Notes (Signed)
Chief Complaint  Patient presents with  . Follow-up    Recheck on back after ESI, 11-07-19.    49 year old female with degenerative disc disease lumbar spine broad-based disc herniation previously treated with Advil and gabapentin 100 mg 3 times a day eventually went for 3 epidural injections the most recent one February 3 she now has pain 3 out of 10 she is able to work she has taken Advil 803 times a day  Recommend we increase her gabapentin to 300 mg take 3 times a day continue her abdomen follow-up in 3 months  Meds ordered this encounter  Medications  . gabapentin (NEURONTIN) 300 MG capsule    Sig: Take 1 capsule (300 mg total) by mouth 3 (three) times daily.    Dispense:  90 capsule    Refill:  5

## 2019-11-26 ENCOUNTER — Ambulatory Visit: Payer: No Typology Code available for payment source

## 2019-11-26 ENCOUNTER — Ambulatory Visit: Payer: No Typology Code available for payment source | Attending: Internal Medicine

## 2019-11-26 ENCOUNTER — Other Ambulatory Visit: Payer: Self-pay

## 2019-11-26 DIAGNOSIS — Z20822 Contact with and (suspected) exposure to covid-19: Secondary | ICD-10-CM

## 2019-11-27 LAB — NOVEL CORONAVIRUS, NAA: SARS-CoV-2, NAA: NOT DETECTED

## 2020-01-11 ENCOUNTER — Other Ambulatory Visit: Payer: Self-pay | Admitting: Orthopedic Surgery

## 2020-01-11 DIAGNOSIS — G8929 Other chronic pain: Secondary | ICD-10-CM

## 2020-02-11 ENCOUNTER — Ambulatory Visit (INDEPENDENT_AMBULATORY_CARE_PROVIDER_SITE_OTHER): Payer: No Typology Code available for payment source | Admitting: Orthopedic Surgery

## 2020-02-11 ENCOUNTER — Other Ambulatory Visit: Payer: Self-pay

## 2020-02-11 ENCOUNTER — Encounter: Payer: Self-pay | Admitting: Orthopedic Surgery

## 2020-02-11 ENCOUNTER — Other Ambulatory Visit: Payer: Self-pay | Admitting: Orthopedic Surgery

## 2020-02-11 ENCOUNTER — Telehealth: Payer: Self-pay | Admitting: Radiology

## 2020-02-11 VITALS — BP 127/90 | HR 81 | Ht 64.0 in | Wt 172.0 lb

## 2020-02-11 DIAGNOSIS — M5441 Lumbago with sciatica, right side: Secondary | ICD-10-CM

## 2020-02-11 DIAGNOSIS — G8929 Other chronic pain: Secondary | ICD-10-CM

## 2020-02-11 NOTE — Addendum Note (Signed)
Addended byCaffie Damme on: 02/11/2020 09:03 AM   Modules accepted: Orders

## 2020-02-11 NOTE — Patient Instructions (Signed)
Make referral to neurosurgery

## 2020-02-11 NOTE — Telephone Encounter (Signed)
Left message for Mary Holland to see if they can get her in for another injection, and to let her know if now is too soon.

## 2020-02-11 NOTE — Telephone Encounter (Signed)
Mary Holland, call [facility] and see if she can have another ESI she has had 3 but starting to wear off.

## 2020-02-11 NOTE — Progress Notes (Signed)
Chief Complaint  Patient presents with  . Back Pain    had 3 ESIs helped but wearing off    49 year old female followed for lower back pain with right leg radiculopathy has had 3 epidural injections she is currently on ibuprofen and gabapentin 803 100 mg 3 times a day respectively  Continues to have pain of the lower back radiating to the right leg and when the pain is at its worst is a 10 out of 10 and sets her back where she has to get off her feet and rest  Her MRI shows an L4-5 mild broad-based disc bulge with a small central disc protrusion and bilateral facet arthritis with L5-S1 broad-based disc bulge with broad central disc protrusion eccentric to the left with bilateral subarticular recess narrowing and moderate left and mild right foraminal stenosis  She is interested in a second series of injections last 1 being January 27 but she is also interested in seeing neurosurgery for possible surgical intervention.  "I am tired of hurting"  Encounter Diagnosis  Name Primary?  . Chronic midline low back pain with right-sided sciatica Yes

## 2020-02-27 ENCOUNTER — Inpatient Hospital Stay: Admission: RE | Admit: 2020-02-27 | Payer: No Typology Code available for payment source | Source: Ambulatory Visit

## 2020-03-05 ENCOUNTER — Other Ambulatory Visit: Payer: Self-pay

## 2020-03-05 ENCOUNTER — Ambulatory Visit
Admission: RE | Admit: 2020-03-05 | Discharge: 2020-03-05 | Disposition: A | Discharge status: Home / Self Care | Payer: No Typology Code available for payment source | Source: Ambulatory Visit | Attending: Orthopedic Surgery | Admitting: Orthopedic Surgery

## 2020-03-05 DIAGNOSIS — M5441 Lumbago with sciatica, right side: Secondary | ICD-10-CM

## 2020-03-05 MED ORDER — METHYLPREDNISOLONE ACETATE 40 MG/ML INJ SUSP (RADIOLOG
120.0000 mg | Freq: Once | INTRAMUSCULAR | Status: AC
  Administered 2020-03-05: 120 mg via EPIDURAL

## 2020-03-05 MED ORDER — IOPAMIDOL (ISOVUE-M 200) INJECTION 41%
1.0000 mL | Freq: Once | INTRAMUSCULAR | Status: AC
  Administered 2020-03-05: 1 mL via EPIDURAL

## 2020-03-05 NOTE — Discharge Instructions (Signed)

## 2020-07-21 ENCOUNTER — Other Ambulatory Visit: Payer: Self-pay | Admitting: Obstetrics & Gynecology

## 2020-08-07 ENCOUNTER — Other Ambulatory Visit: Payer: Self-pay

## 2020-08-07 ENCOUNTER — Encounter (HOSPITAL_COMMUNITY): Payer: Self-pay | Admitting: *Deleted

## 2020-08-07 ENCOUNTER — Emergency Department (HOSPITAL_COMMUNITY)
Admission: EM | Admit: 2020-08-07 | Discharge: 2020-08-08 | Disposition: A | Discharge status: Home / Self Care | Payer: No Typology Code available for payment source | Attending: Emergency Medicine | Admitting: Emergency Medicine

## 2020-08-07 DIAGNOSIS — F17210 Nicotine dependence, cigarettes, uncomplicated: Secondary | ICD-10-CM | POA: Insufficient documentation

## 2020-08-07 DIAGNOSIS — B9689 Other specified bacterial agents as the cause of diseases classified elsewhere: Secondary | ICD-10-CM | POA: Diagnosis not present

## 2020-08-07 DIAGNOSIS — L02414 Cutaneous abscess of left upper limb: Secondary | ICD-10-CM | POA: Diagnosis not present

## 2020-08-07 DIAGNOSIS — L0291 Cutaneous abscess, unspecified: Secondary | ICD-10-CM

## 2020-08-07 DIAGNOSIS — M25512 Pain in left shoulder: Secondary | ICD-10-CM | POA: Diagnosis present

## 2020-08-07 MED ORDER — LIDOCAINE HCL (PF) 2 % IJ SOLN
10.0000 mL | Freq: Once | INTRAMUSCULAR | Status: AC
  Administered 2020-08-08: 10 mL

## 2020-08-07 NOTE — ED Triage Notes (Signed)
Pt had her covid vaccine x 6 days ago and at the injection site she has a large red area with a pustule; pt c/o pain

## 2020-08-07 NOTE — ED Provider Notes (Signed)
[facility] EMERGENCY DEPARTMENT Provider Note   CSN: [id number] Arrival date & time: 08/07/20  2050     History Chief Complaint  Patient presents with  . Abscess    Mary Holland is a 49 y.o. female.  Patient presents painful swelling of the left deltoid area after receiving Covid vaccination. Area is red and warm to the touch. She has not had any drainage or fever.        Past Medical History:  Diagnosis Date  . Chronic back pain   . DDD (degenerative disc disease), cervical   . Narcolepsy   . Ovarian cyst   . Sciatic pain     There are no problems to display for this patient.   Past Surgical History:  Procedure Laterality Date  . ABDOMINAL HYSTERECTOMY    . CESAREAN SECTION    . KNEE SURGERY Right    arthroscopy  . partial hysterectomy    . removal of left ovary    . removal rt ovary       OB History   No obstetric history on file.     History reviewed. No pertinent family history.  Social History   Tobacco Use  . Smoking status: Current Every Day Smoker    Packs/day: 1.00    Years: 10.00    Pack years: 10.00    Types: Cigarettes  . Smokeless tobacco: Never Used  Substance Use Topics  . Alcohol use: Yes    Comment: occassionally   . Drug use: No    Home Medications Prior to Admission medications   Medication Sig Start Date End Date Taking? Authorizing Provider  doxycycline (VIBRAMYCIN) 100 MG capsule Take 1 capsule (100 mg total) by mouth 2 (two) times daily. 08/08/20   Allisa Einspahr, Canary Brim, MD  estradiol (ESTRACE) 2 MG tablet TAKE ONE TABLET BY MOUTH ONCE DAILY. 07/21/20   Lazaro Arms, MD  gabapentin (NEURONTIN) 300 MG capsule Take 1 capsule (300 mg total) by mouth 3 (three) times daily. 11/12/19   Vickki Hearing, MD  ibuprofen (ADVIL) 800 MG tablet TAKE (1) TABLET BY MOUTH EVERY EIGHT HOURS AS NEEDED. 01/11/20   Vickki Hearing, MD    Allergies    Patient has no known allergies.  Review of Systems   Review of Systems    Musculoskeletal: Positive for myalgias.  Skin: Positive for color change.    Physical Exam Updated Vital Signs BP 137/82 (BP Location: Right Arm)   Pulse 93   Temp 98.1 F (36.7 C) (Oral)   Resp 18   Ht 5\' 4"  (1.626 m)   Wt 79.4 kg   SpO2 96%   BMI 30.04 kg/m   Physical Exam Vitals and nursing note reviewed.  Constitutional:      Appearance: Normal appearance.  HENT:     Head: Normocephalic and atraumatic.  Cardiovascular:     Rate and Rhythm: Normal rate and regular rhythm.  Pulmonary:     Effort: Pulmonary effort is normal.     Breath sounds: Normal breath sounds.  Musculoskeletal:        General: Tenderness (Left deltoid) present. Normal range of motion.  Skin:    Comments: Tenderness, erythema, swelling with slight induration left deltoid. Central pustule present.  Neurological:     Mental Status: She is alert.     ED Results / Procedures / Treatments   Labs (all labs ordered are listed, but only abnormal results are displayed) Labs Reviewed - No data to display  EKG None  Radiology No results found.  Procedures .Marland KitchenIncision and Drainage  Date/Time: 08/08/2020 7:48 AM Performed by: Gilda Crease, MD Authorized by: Gilda Crease, MD   Consent:    Consent obtained:  Verbal   Consent given by:  Patient   Risks discussed:  Bleeding, incomplete drainage and pain Universal protocol:    Procedure explained and questions answered to patient or proxy's satisfaction: yes     Site/side marked: yes     Immediately prior to procedure a time out was called: yes     Patient identity confirmed:  Verbally with patient Location:    Type:  Abscess   Size:  2cm   Location:  Upper extremity   Upper extremity location:  Shoulder   Shoulder location:  L shoulder Pre-procedure details:    Skin preparation:  Betadine Anesthesia (see MAR for exact dosages):    Anesthesia method:  Local infiltration   Local anesthetic:  Lidocaine 2% w/o  epi Procedure type:    Complexity:  Simple Procedure details:    Needle aspiration: no     Incision types:  Single straight   Scalpel blade:  11   Wound management:  Probed and deloculated and irrigated with saline   Drainage:  Purulent   Drainage amount:  Moderate   Wound treatment:  Wound left open   Packing materials:  None Post-procedure details:    Patient tolerance of procedure:  Tolerated well, no immediate complications   (including critical care time)  Medications Ordered in ED Medications  lidocaine HCl (PF) (XYLOCAINE) 2 % injection 10 mL (10 mLs Infiltration Given 08/08/20 0051)  doxycycline (VIBRA-TABS) tablet 100 mg (100 mg Oral Given 08/08/20 0051)    ED Course  I have reviewed the triage vital signs and the nursing notes.  Pertinent labs & imaging results that were available during my care of the patient were reviewed by me and considered in my medical decision making (see chart for details).    MDM Rules/Calculators/A&P                           Final Clinical Impression(s) / ED Diagnoses Final diagnoses:  Abscess    Rx / DC Orders ED Discharge Orders         Ordered    doxycycline (VIBRAMYCIN) 100 MG capsule  2 times daily        08/08/20 0021           Gilda Crease, MD 08/08/20 [phone]

## 2020-08-08 MED ORDER — DOXYCYCLINE HYCLATE 100 MG PO CAPS
100.0000 mg | ORAL_CAPSULE | Freq: Two times a day (BID) | ORAL | 0 refills | Status: DC
Start: 2020-08-08 — End: 2021-02-09

## 2020-08-08 MED ORDER — DOXYCYCLINE HYCLATE 100 MG PO TABS
100.0000 mg | ORAL_TABLET | Freq: Once | ORAL | Status: AC
  Administered 2020-08-08: 100 mg via ORAL

## 2020-08-08 MED ORDER — DOXYCYCLINE HYCLATE 100 MG PO TABS
100.0000 mg | ORAL_TABLET | Freq: Once | ORAL | Status: DC
  Filled 2020-08-08: qty 1

## 2020-09-11 ENCOUNTER — Emergency Department (HOSPITAL_COMMUNITY)
Admission: EM | Admit: 2020-09-11 | Discharge: 2020-09-11 | Disposition: A | Discharge status: Home / Self Care | Payer: No Typology Code available for payment source | Attending: Emergency Medicine | Admitting: Emergency Medicine

## 2020-09-11 ENCOUNTER — Other Ambulatory Visit: Payer: Self-pay

## 2020-09-11 ENCOUNTER — Encounter (HOSPITAL_COMMUNITY): Payer: Self-pay | Admitting: Emergency Medicine

## 2020-09-11 DIAGNOSIS — M25511 Pain in right shoulder: Secondary | ICD-10-CM | POA: Insufficient documentation

## 2020-09-11 DIAGNOSIS — M5412 Radiculopathy, cervical region: Secondary | ICD-10-CM | POA: Insufficient documentation

## 2020-09-11 DIAGNOSIS — F17210 Nicotine dependence, cigarettes, uncomplicated: Secondary | ICD-10-CM | POA: Insufficient documentation

## 2020-09-11 DIAGNOSIS — Z79899 Other long term (current) drug therapy: Secondary | ICD-10-CM | POA: Diagnosis not present

## 2020-09-11 MED ORDER — PREDNISONE 50 MG PO TABS
50.0000 mg | ORAL_TABLET | Freq: Every day | ORAL | 0 refills | Status: AC
Start: 2020-09-11 — End: 2020-09-16

## 2020-09-11 MED ORDER — LIDOCAINE 5 % EX PTCH
1.0000 | MEDICATED_PATCH | CUTANEOUS | Status: DC
  Administered 2020-09-11: 1 via TRANSDERMAL
  Filled 2020-09-11: qty 1

## 2020-09-11 MED ORDER — KETOROLAC TROMETHAMINE 30 MG/ML IJ SOLN
30.0000 mg | Freq: Once | INTRAMUSCULAR | Status: AC
  Administered 2020-09-11: 30 mg via INTRAMUSCULAR
  Filled 2020-09-11: qty 1

## 2020-09-11 MED ORDER — METHOCARBAMOL 500 MG PO TABS
500.0000 mg | ORAL_TABLET | Freq: Two times a day (BID) | ORAL | 0 refills | Status: DC | PRN
Start: 2020-09-11 — End: 2021-02-09

## 2020-09-11 NOTE — ED Provider Notes (Signed)
[facility] EMERGENCY DEPARTMENT Provider Note   CSN: [id number] Arrival date & time: 09/11/20  1008     History Chief Complaint  Patient presents with  . Arm Pain    Right arm pain    Mary Holland is a 49 y.o. female who presents with concern for 3 days of right shoulder pain.  She states this pain is dull, worse with movement, and causes shoots of pain down her arm with pins and needles sensation in her right fingers. She states that she woke up approximately 3 days ago and noted soreness and tightness in her right shoulder at that time.  She denies injury, change in activity, new exercise.  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 She has utilized Tylenol and ibuprofen at home with minimal relief.  She did experience some relief from heating pad, however stated that pain resumed in severity after she began to move around again.  At this time she states range of motion is decreased due to pain. Denies weakness in the RUE.   She denies numbness, weakness in the right arm, although she states she is using the arm less due to the pain..  She is right-hand dominant.  She denies fevers, chills at home, denies abdominal pain, nausea, vomiting, diarrhea, chest pain, shortness of breath, palpitations.  She denies injury to the shoulder in the past.   Denies recent travel, immobilization, surgical procedure.  Denies history of blood clot or malignancy, is currently on Estrace supplement.  I personally reviewed this patient's medical records.  She has history of narcolepsy, sciatic pain, degenerative disc disease, chronic back pain.  Denies history of neck pain.  Denies history of IV drug use.  HPI     Past Medical History:  Diagnosis Date  . Chronic back pain   . DDD (degenerative disc disease), cervical   . Narcolepsy   . Ovarian cyst   . Sciatic pain     There are no problems to display for this patient.   Past Surgical History:  Procedure Laterality Date  .  ABDOMINAL HYSTERECTOMY    . CESAREAN SECTION    . KNEE SURGERY Right    arthroscopy  . partial hysterectomy    . removal of left ovary    . removal rt ovary       OB History   No obstetric history on file.     History reviewed. No pertinent family history.  Social History   Tobacco Use  . Smoking status: Current Every Day Smoker    Packs/day: 1.00    Years: 10.00    Pack years: 10.00    Types: Cigarettes  . Smokeless tobacco: Never Used  Substance Use Topics  . Alcohol use: Yes    Comment: occassionally   . Drug use: No    Home Medications Prior to Admission medications   Medication Sig Start Date End Date Taking? Authorizing Provider  estradiol (ESTRACE) 2 MG tablet TAKE ONE TABLET BY MOUTH ONCE DAILY. 07/21/20  Yes Lazaro ArmsEure, Luther H, MD  ibuprofen (ADVIL) 800 MG tablet TAKE (1) TABLET BY MOUTH EVERY EIGHT HOURS AS NEEDED. 01/11/20  Yes Vickki HearingHarrison, Stanley E, MD  doxycycline (VIBRAMYCIN) 100 MG capsule Take 1 capsule (100 mg total) by mouth 2 (two) times daily. Patient not taking: Reported on 09/11/2020 08/08/20   Gilda CreasePollina, Christopher J, MD  gabapentin (NEURONTIN) 300 MG capsule Take 1 capsule (300 mg total) by mouth 3 (three) times daily. Patient not taking: Reported on  09/11/2020 11/12/19   Vickki Hearing, MD  methocarbamol (ROBAXIN) 500 MG tablet Take 1 tablet (500 mg total) by mouth 2 (two) times daily as needed for muscle spasms. 09/11/20   Tasneem Cormier, Eugene Gavia, PA-C  predniSONE (DELTASONE) 50 MG tablet Take 1 tablet (50 mg total) by mouth daily for 5 days. 09/11/20 09/16/20  Kateryn Marasigan, Eugene Gavia, PA-C    Allergies    Patient has no known allergies.  Review of Systems   Review of Systems  Constitutional: Negative for activity change, appetite change, chills, fatigue and fever.  HENT: Negative.   Respiratory: Negative.  Negative for cough, chest tightness, shortness of breath and wheezing.   Cardiovascular: Negative.  Negative for chest pain, palpitations and leg  swelling.  Gastrointestinal: Negative for abdominal pain, diarrhea, nausea and vomiting.  Genitourinary: Negative.   Musculoskeletal: Positive for myalgias.       Right shoulder pain, pins+needles in right fingers    Skin: Negative.   Neurological: Negative.  Negative for dizziness, tremors, seizures, syncope, facial asymmetry, speech difficulty, weakness, light-headedness, numbness and headaches.  Hematological: Negative.   Psychiatric/Behavioral: Negative.     Physical Exam Updated Vital Signs BP (!) 152/95 (BP Location: Left Arm)   Pulse 69   Temp (!) 97.1 F (36.2 C) (Tympanic)   Resp 16   Ht 5\' 4"  (1.626 m)   Wt 78 kg   SpO2 100%   BMI 29.52 kg/m   Physical Exam Vitals and nursing note reviewed.  Constitutional:      Appearance: Normal appearance. She is not ill-appearing.  HENT:     Head: Normocephalic and atraumatic.     Nose: Nose normal.     Mouth/Throat:     Mouth: Mucous membranes are dry.     Pharynx: Oropharynx is clear. Uvula midline. No oropharyngeal exudate or posterior oropharyngeal erythema.  Eyes:     General:        Right eye: No discharge.        Left eye: No discharge.     Extraocular Movements: Extraocular movements intact.     Conjunctiva/sclera: Conjunctivae normal.     Pupils: Pupils are equal, round, and reactive to light.  Neck:     Trachea: Trachea and phonation normal.  Cardiovascular:     Rate and Rhythm: Normal rate and regular rhythm.     Pulses: Normal pulses.          Radial pulses are 2+ on the right side and 2+ on the left side.       Dorsalis pedis pulses are 2+ on the right side and 2+ on the left side.     Heart sounds: Normal heart sounds. No murmur heard.   Pulmonary:     Effort: Pulmonary effort is normal. No respiratory distress.     Breath sounds: Normal breath sounds. No wheezing or rales.  Chest:     Chest wall: No deformity, swelling, tenderness, crepitus or edema.  Abdominal:     General: Bowel sounds are  normal. There is no distension.     Palpations: Abdomen is soft.     Tenderness: There is no abdominal tenderness. There is no guarding or rebound.  Musculoskeletal:        General: Tenderness present. No deformity.     Right shoulder: Tenderness present. No bony tenderness or crepitus. Decreased range of motion.     Left shoulder: Normal.     Right upper arm: Normal. No tenderness or bony tenderness.  Left upper arm: Normal.     Right elbow: Normal. Normal range of motion. No tenderness.     Left elbow: Normal.     Right forearm: Normal. No bony tenderness.     Right wrist: Normal. No tenderness or bony tenderness.     Right hand: Normal. No tenderness or bony tenderness. Normal range of motion. Normal strength. Normal sensation. Normal capillary refill. Normal pulse.     Left hand: Normal.       Arms:     Cervical back: Normal range of motion and neck supple. Spasms and tenderness present. No edema, rigidity or crepitus. Muscular tenderness present. No pain with movement or spinous process tenderness.     Thoracic back: Spasms present. No tenderness or bony tenderness.     Lumbar back: No spasms, tenderness or bony tenderness.     Right lower leg: No edema.     Left lower leg: No edema.     Comments: Full passive range of motion of the right shoulder.  Patient unwilling to range her shoulder herself due to pain with movement.  Lymphadenopathy:     Cervical: No cervical adenopathy.  Skin:    General: Skin is warm and dry.     Capillary Refill: Capillary refill takes less than 2 seconds.  Neurological:     General: No focal deficit present.     Mental Status: She is alert and oriented to person, place, and time. Mental status is at baseline.     Sensory: Sensation is intact.     Motor: Motor function is intact.  Psychiatric:        Mood and Affect: Mood normal.     ED Results / Procedures / Treatments   Labs (all labs ordered are listed, but only abnormal results are  displayed) Labs Reviewed - No data to display  EKG None  Radiology No results found.  Procedures Procedures (including critical care time)  Medications Ordered in ED Medications  lidocaine (LIDODERM) 5 % 1 patch (1 patch Transdermal Patch Applied 09/11/20 1256)  ketorolac (TORADOL) 30 MG/ML injection 30 mg (30 mg Intramuscular Given 09/11/20 1255)    ED Course  I have reviewed the triage vital signs and the nursing notes.  Pertinent labs & imaging results that were available during my care of the patient were reviewed by me and considered in my medical decision making (see chart for details).    MDM Rules/Calculators/A&P                         49 year old female presents with 3 days of progressively worsening right shoulder pain, now with pins and needles sensation in her right hand.  Differential diagnosis for this patient symptoms include but are not limited to muscle spasm, cervical radiculopathy, cervical stenosis, epidural abscess, transverse myelitis, rotator cuff injury, septic joint, ACS, GERD, herpes zoster.   Mildly hypertensive on intake 139/93. Vital signs otherwise normal. Patient is afebrile.  Physical exam reassuring. There are no infectious signs such as erythema, warmth to touch, crepitus of the shoulder. No skin changes suggestive of herpes zoster. Patient is neurovascularly intact in both upper extremities with obvious spasm of the trapezius bilaterally, R>L, as well as spasm of the R deltoids. There is no midline tenderness to palpation of the cervical, thoracic, or lumbar spine.  Physical exam suggestive of muscular spasm with likely cervical radiculopathy; I am no longer concerned regarding infectious etiologies at this time.  Lidocaine patch and  Toradol administered in the department.  Will discharge with steroids, Robaxin.  She may utilize topical analgesia.  No further work-up is warranted in the emergency department at this time given reassuring  physical exam and vital signs. Recommend close follow-up with primary care doctor.   Mary Holland voiced understanding of her medical evaluation and treatment plan. Each of her questions were answered to her expressed satisfaction. Strict return precautions were given. Patient is well-appearing, stable, and appropriate for discharge at this time.   Final Clinical Impression(s) / ED Diagnoses Final diagnoses:  Cervical radiculopathy  Acute pain of right shoulder    Rx / DC Orders ED Discharge Orders         Ordered    predniSONE (DELTASONE) 50 MG tablet  Daily        09/11/20 1259    methocarbamol (ROBAXIN) 500 MG tablet  2 times daily PRN        09/11/20 1259           Kaiven Vester, Eugene Gavia, PA-C 09/11/20 1523    Bethann Berkshire, MD 09/12/20 1028

## 2020-09-11 NOTE — Discharge Instructions (Addendum)
Your evaluated in the emergency department today for your right shoulder and neck pain.  Your physical exam and vital signs are very reassuring. There does not appear to be an emergent cause of your symptoms at this time.   The muscles of your right shoulder and neck are in spasm, meaning they are inappropriately tightened up.  This can happen for a variety of reasons, and it appears that you slept in awkward position a few nights ago which may have been the inciting incident for your pain.  There is likely some inflammation of the nerves in your neck secondary to this muscle spasm, which can cause the pins-and-needles sensation in your hand that you are experiencing.  You have been prescribed a medication called prednisone to help with this.  Additionally been prescribed medication called Robaxin which is a muscle relaxer topical muscle spasm.  Please be advised this medication can make you very sleepy so should not drive or operate heavy machinery while you are taking it.  Additionally may continue to utilize Tylenol or ibuprofen as needed.  You may also utilize topical pain relief such as Biofreeze, icy hot, lidocaine patches that you keep the area without shower, hot towel, or heating pad and massage the area afterward as muscles relax. You may follow-up with your primary care doctor.   Please return to the emergency department if you develop any worsening numbness, tingling, or weakness in the hand- such that you are dropping objects-so that we can reevaluate you.

## 2020-09-11 NOTE — ED Notes (Signed)
ED Provider at bedside. 

## 2020-09-11 NOTE — ED Triage Notes (Signed)
C/o right arm pain for last 2 days, denies injury.  C/o right shoulder pain, radiating to finger, numbness and tinging.  Rates pain 10/10.

## 2021-02-09 ENCOUNTER — Ambulatory Visit (INDEPENDENT_AMBULATORY_CARE_PROVIDER_SITE_OTHER): Payer: PRIVATE HEALTH INSURANCE | Admitting: Orthopedic Surgery

## 2021-02-09 ENCOUNTER — Other Ambulatory Visit: Payer: Self-pay

## 2021-02-09 ENCOUNTER — Encounter: Payer: Self-pay | Admitting: Orthopedic Surgery

## 2021-02-09 ENCOUNTER — Ambulatory Visit: Payer: PRIVATE HEALTH INSURANCE

## 2021-02-09 VITALS — BP 155/91 | HR 72 | Ht 64.0 in | Wt 170.0 lb

## 2021-02-09 DIAGNOSIS — M25562 Pain in left knee: Secondary | ICD-10-CM

## 2021-02-09 DIAGNOSIS — M1710 Unilateral primary osteoarthritis, unspecified knee: Secondary | ICD-10-CM

## 2021-02-09 DIAGNOSIS — M1712 Unilateral primary osteoarthritis, left knee: Secondary | ICD-10-CM | POA: Diagnosis not present

## 2021-02-09 DIAGNOSIS — G8929 Other chronic pain: Secondary | ICD-10-CM

## 2021-02-09 MED ORDER — PREDNISONE 10 MG (48) PO TBPK: ORAL_TABLET | Freq: Every day | ORAL | 0 refills | Status: DC

## 2021-02-09 NOTE — Patient Instructions (Signed)
Ice the knee if swollen   Wear brace for 6 weeks   Take this medication   Meds ordered this encounter  Medications  . predniSONE (STERAPRED UNI-PAK 48 TAB) 10 MG (48) TBPK tablet    Sig: Take by mouth daily. 10 mg 12 days as directed    Dispense:  48 tablet    Refill:  0

## 2021-02-09 NOTE — Progress Notes (Signed)
Mary Holland  02/09/2021  Body mass index is 29.18 kg/m.  ASSESSMENT AND PLAN:     Unclear, knee effusion from OA or L spine causing giving way   Return if no improvement with dose pack and brace    Encounter Diagnoses  Name Primary?  . Acute pain of left knee Yes  . Primary localized osteoarthritis of knee, left     Imaging 3 views left knee  Plan:  (Rx., Inj., surg., Frx, MRI/CT, XR:2)  Brace econ hinge   Meds ordered this encounter  Medications  . predniSONE (STERAPRED UNI-PAK 48 TAB) 10 MG (48) TBPK tablet    Sig: Take by mouth daily. 10 mg 12 days as directed    Dispense:  48 tablet    Refill:  0   F/u prn     HISTORY SECTION :  Chief Complaint  Patient presents with  . Knee Pain    Bilateral    50 year old female status post arthroscopy right knee back in 2007 her last x-ray from 2020 showed osteoarthritis of the lateral compartment  She presents now with a history of 2 weeks ago having bilateral knee swelling which subsequently resolved but her left knee will now give way with walking  The swelling again is gone down but she still has intermittent giving way symptoms of the left knee when she is walking  She does have some pain in her back and hip no numbness or tingling or pain below the knee.  The hip will hurt when the knee is giving out  She completed a complete course of epidural steroid injections which relieved most of her back and leg pain     Review of Systems  Constitutional: Negative for chills, fever, malaise/fatigue and weight loss.  Gastrointestinal: Negative for constipation.       Denies loss bowel control   Genitourinary:       Denies urinary retention or los of bladder control      has a past medical history of Chronic back pain, DDD (degenerative disc disease), cervical, Narcolepsy, Ovarian cyst, and Sciatic pain.   Past Surgical History:  Procedure Laterality Date  . ABDOMINAL HYSTERECTOMY    . CESAREAN SECTION     . KNEE SURGERY Right    arthroscopy  . partial hysterectomy    . removal of left ovary    . removal rt ovary      Social History   Tobacco Use  . Smoking status: Current Every Day Smoker    Packs/day: 1.00    Years: 10.00    Pack years: 10.00    Types: Cigarettes  . Smokeless tobacco: Never Used  Substance Use Topics  . Alcohol use: Yes    Comment: occassionally   . Drug use: No    History reviewed. No pertinent family history.    No Known Allergies   Current Outpatient Medications:  .  estradiol (ESTRACE) 2 MG tablet, TAKE ONE TABLET BY MOUTH ONCE DAILY., Disp: 30 tablet, Rfl: 11 .  predniSONE (STERAPRED UNI-PAK 48 TAB) 10 MG (48) TBPK tablet, Take by mouth daily. 10 mg 12 days as directed, Disp: 48 tablet, Rfl: 0   PHYSICAL EXAM SECTION: BP (!) 155/91   Pulse 72   Ht 5\' 4"  (1.626 m)   Wt 170 lb (77.1 kg)   BMI 29.18 kg/m   Body mass index is 29.18 kg/m.   General appearance: Well-developed well-nourished no gross deformities   Cardiovascular normal pulse and perfusion  both  lower extremities without edema Neurologically deep tendon reflexes are equal and normal, no sensation loss or deficits no pathologic reflexes  Psychological: Awake alert and oriented x3 mood and affect normal  Skin no lacerations or ulcerations no nodularity no palpable masses, no erythema or nodularity  Musculoskeletal:   Left knee: No effusion No tenderness Full range of motion Ligaments stable Muscle tone normal  11:51 AM

## 2021-08-04 ENCOUNTER — Other Ambulatory Visit: Payer: Self-pay | Admitting: Obstetrics & Gynecology

## 2021-08-21 ENCOUNTER — Telehealth: Payer: Self-pay | Admitting: Obstetrics & Gynecology

## 2021-08-21 ENCOUNTER — Other Ambulatory Visit: Payer: Self-pay

## 2021-08-21 MED ORDER — ESTRADIOL 2 MG PO TABS: 2.0000 mg | ORAL_TABLET | Freq: Every day | ORAL | 11 refills | Status: DC

## 2021-08-21 NOTE — Telephone Encounter (Signed)
Patient came in today asking if her pharmacy had sent in a refill request. I do see a telephone message from them but it hasn't been seen. She needs a refill on her estradiol.

## 2021-10-15 ENCOUNTER — Encounter (HOSPITAL_COMMUNITY): Payer: Self-pay

## 2021-10-15 ENCOUNTER — Emergency Department (HOSPITAL_COMMUNITY)
Admission: EM | Admit: 2021-10-15 | Discharge: 2021-10-15 | Disposition: A | Discharge status: Home / Self Care | Payer: No Typology Code available for payment source | Attending: Emergency Medicine | Admitting: Emergency Medicine

## 2021-10-15 ENCOUNTER — Other Ambulatory Visit: Payer: Self-pay

## 2021-10-15 DIAGNOSIS — F17210 Nicotine dependence, cigarettes, uncomplicated: Secondary | ICD-10-CM | POA: Diagnosis not present

## 2021-10-15 DIAGNOSIS — M25562 Pain in left knee: Secondary | ICD-10-CM | POA: Insufficient documentation

## 2021-10-15 MED ORDER — NAPROXEN 500 MG PO TABS: 500.0000 mg | ORAL_TABLET | Freq: Two times a day (BID) | ORAL | 0 refills | Status: DC

## 2021-10-15 MED ORDER — NAPROXEN 250 MG PO TABS
500.0000 mg | ORAL_TABLET | Freq: Once | ORAL | Status: AC
  Administered 2021-10-15: 500 mg via ORAL
  Filled 2021-10-15: qty 2

## 2021-10-15 NOTE — ED Triage Notes (Signed)
Pt complains of swelling and pain in left leg that started two days ago. Says that leg has throbbing sensation that gets worse when she ambulates.

## 2021-10-15 NOTE — ED Provider Notes (Signed)
[facility]-EMERGENCY DEPT [facility] Emergency Department Provider Note MRN:  [id number]  Arrival date & time: 10/15/21     Chief Complaint   Leg Pain   History of Present Illness   Mary Holland is a 51 y.o. year-old female with no pertinent past medical history presenting to the ED with chief complaint of leg pain.  Pain in the left knee for the past 2 or 3 days.  Denies trauma.  Stumbled awkwardly few days ago and has had pain since.  Pain is worse with walking.  No other complaints, no fever.  Review of Systems  A thorough review of systems was obtained and all systems are negative except as noted in the HPI and PMH.   Patient's Health History    Past Medical History:  Diagnosis Date   Chronic back pain    DDD (degenerative disc disease), cervical    Narcolepsy    Ovarian cyst    Sciatic pain     Past Surgical History:  Procedure Laterality Date   ABDOMINAL HYSTERECTOMY     CESAREAN SECTION     KNEE SURGERY Right    arthroscopy   partial hysterectomy     removal of left ovary     removal rt ovary      History reviewed. No pertinent family history.  Social History   Socioeconomic History   Marital status: Married    Spouse name: Not on file   Number of children: Not on file   Years of education: Not on file   Highest education level: Not on file  Occupational History   Not on file  Tobacco Use   Smoking status: Every Day    Packs/day: 1.00    Years: 10.00    Pack years: 10.00    Types: Cigarettes   Smokeless tobacco: Never  Substance and Sexual Activity   Alcohol use: Not Currently    Comment: occassionally    Drug use: No   Sexual activity: Yes    Birth control/protection: Surgical  Other Topics Concern   Not on file  Social History Narrative   Not on file   Social Determinants of Health   Financial Resource Strain: Not on file  Food Insecurity: Not on file  Transportation Needs: Not on file  Physical Activity: Not on file  Stress: Not  on file  Social Connections: Not on file  Intimate Partner Violence: Not on file     Physical Exam   Vitals:   10/15/21 0543  Temp: 98 F (36.7 C)    CONSTITUTIONAL: Well-appearing, NAD NEURO/PSYCH:  Alert and oriented x 3, no focal deficits EYES:  eyes equal and reactive ENT/NECK:  no LAD, no JVD CARDIO: Regular rate, well-perfused, normal S1 and S2 PULM:  CTAB no wheezing or rhonchi GI/GU:  non-distended, non-tender MSK/SPINE:  No gross deformities, no edema, tenderness to palpation to the left medial knee, largely preserved range of motion, neurovascularly intact distally SKIN:  no rash, atraumatic   *Additional and/or pertinent findings included in MDM below  Diagnostic and Interventional Summary    EKG Interpretation  Date/Time:    Ventricular Rate:    PR Interval:    QRS Duration:   QT Interval:    QTC Calculation:   R Axis:     Text Interpretation:         Labs Reviewed - No data to display  No orders to display    Medications  naproxen (NAPROSYN) tablet 500 mg (has no administration in  time range)     Procedures  /  Critical Care Procedures  ED Course and Medical Decision Making  Initial Impression and Ddx Documented history of osteoarthritis in this knee in the past, here with likely flareup of OA.  The extremities appear normal and equal, strong DP pulses, neurovascularly intact, largely preserved range of motion, no erythema, no increased warmth, doubt vascular compromise, nothing to suggest infection.  No real indication for imaging today given the lack of trauma.  Appropriate for reassurance, symptomatic management at home.  Past medical/surgical history that increases complexity of ED encounter: None  Interpretation of Diagnostics N/A  Patient Reassessment and Ultimate Disposition/Management Discharge home  Patient management required discussion with the following services or consulting groups:  None  Complexity of Problems  Addressed Chronic illness with exacerbation  Additional Data Reviewed and Analyzed Further history obtained from: Prior imaging results  Patient Encounter Risk Assessment Moderate:  Prescriptions  Elmer Sow. Pilar Plate, MD [facility] Emergency Medicine [facility] [email or web address]  Final Clinical Impressions(s) / ED Diagnoses     ICD-10-CM   1. Left knee pain, unspecified chronicity  M25.562       ED Discharge Orders     None        Discharge Instructions Discussed with and Provided to Patient:    Discharge Instructions      You were evaluated in the Emergency Department and after careful evaluation, we did not find any emergent condition requiring admission or further testing in the hospital.  Your exam/testing today was overall reassuring.  Symptoms likely due to a flare of arthritis in the knee.  Continue taking Tylenol every 4-6 hours for pain.  We also recommend the Naprosyn twice daily for the next week.  If still hurting after that, would recommend follow-up with your orthopedic specialist.  Please return to the Emergency Department if you experience any worsening of your condition.  Thank you for allowing [place] to be a part of your care.       Sabas Sous, MD 10/15/21 [phone]

## 2021-10-15 NOTE — Discharge Instructions (Addendum)
You were evaluated in the Emergency Department and after careful evaluation, we did not find any emergent condition requiring admission or further testing in the hospital.  Your exam/testing today was overall reassuring.  Symptoms likely due to a flare of arthritis in the knee.  Continue taking Tylenol every 4-6 hours for pain.  We also recommend the Naprosyn twice daily for the next week.  If still hurting after that, would recommend follow-up with your orthopedic specialist.  Please return to the Emergency Department if you experience any worsening of your condition.  Thank you for allowing [place] to be a part of your care.

## 2021-11-05 IMAGING — XA Imaging study
2 series · 2 of 2 positions shown · non-contrast
Comparison: none

CLINICAL DATA: Lumbosacral spondylosis without myelopathy. Chronic
low back and right leg pain. Significant, but short lived relief for
a few days after L5-S1 injection two weeks ago. Repeat injection
requested.

[Series 1: ortho adipose · 1 of 1 slices shown (1 of 2)]
[im 1/1]
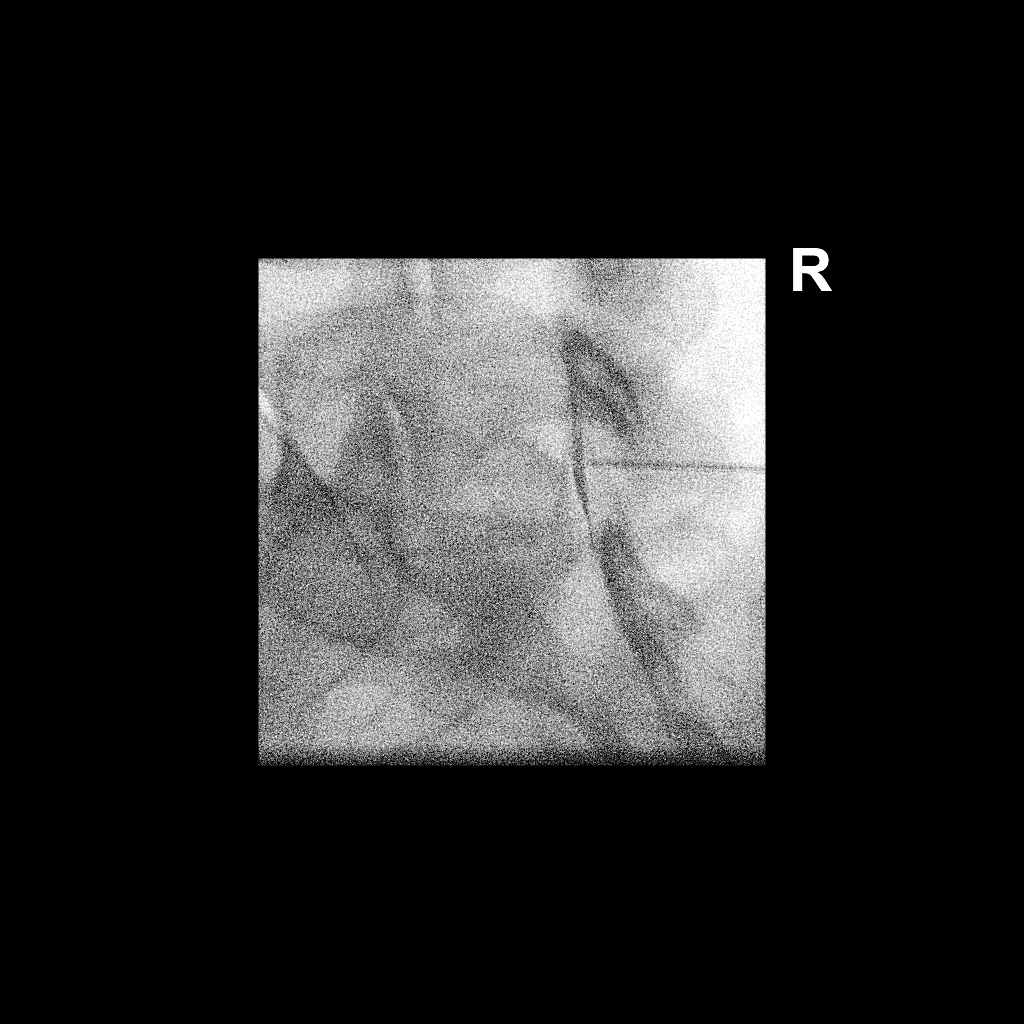

[Series 2: ortho adipose · 1 of 1 slices shown (2 of 2)]
[im 1/1]
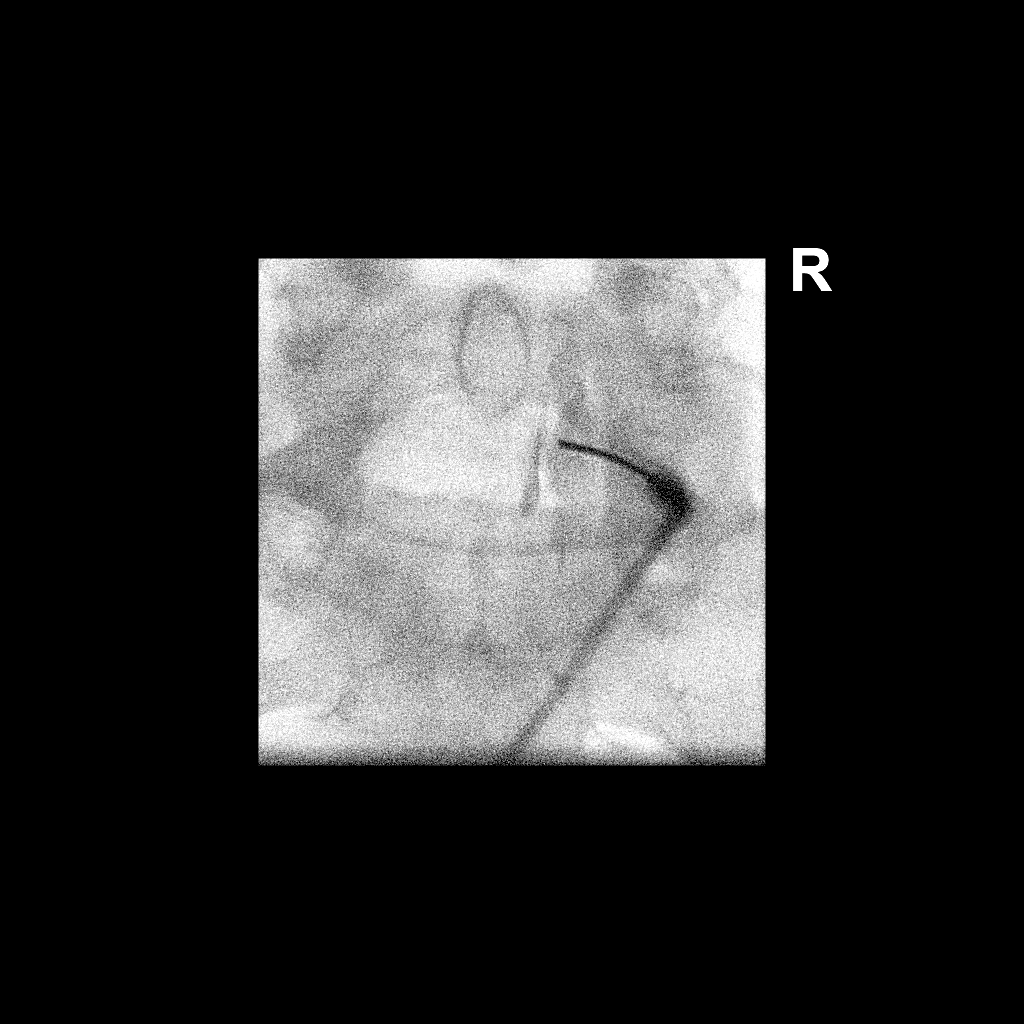

[2 of 2 positions shown; findings below may reference images not displayed]

FLUOROSCOPY TIME:  Radiation Exposure Index (as provided by the
fluoroscopic device): 2.6 mGy

Fluoroscopy Time:  2 seconds

Number of Acquired Images:  0

PROCEDURE:
The procedure, risks, benefits, and alternatives were explained to
the patient. Questions regarding the procedure were encouraged and
answered. The patient understands and consents to the procedure.

LUMBAR EPIDURAL INJECTION:

An interlaminar approach was performed on the right at L5-S1. The
overlying skin was cleansed and anesthetized. A 3.5 inch 20 gauge
epidural needle was advanced using loss-of-resistance technique.

DIAGNOSTIC EPIDURAL INJECTION:

Injection of Isovue-M 200 shows a good epidural pattern with spread
above and below the level of needle placement, primarily on the
right. No vascular opacification is seen.

THERAPEUTIC EPIDURAL INJECTION:

120 mg of Depo-Medrol mixed with 3 mL of 1% lidocaine were
instilled. The procedure was well-tolerated, and the patient was
discharged thirty minutes following the injection in good condition.

COMPLICATIONS:
None immediate.
IMPRESSION: Technically successful interlaminar epidural injection on the right
at L5-S1.

## 2021-12-28 ENCOUNTER — Encounter: Payer: Self-pay | Admitting: Orthopedic Surgery

## 2021-12-28 ENCOUNTER — Ambulatory Visit (INDEPENDENT_AMBULATORY_CARE_PROVIDER_SITE_OTHER): Payer: PRIVATE HEALTH INSURANCE | Admitting: Orthopedic Surgery

## 2021-12-28 ENCOUNTER — Other Ambulatory Visit: Payer: Self-pay

## 2021-12-28 VITALS — BP 153/105 | HR 89 | Ht 64.0 in | Wt 175.0 lb

## 2021-12-28 DIAGNOSIS — M25562 Pain in left knee: Secondary | ICD-10-CM | POA: Diagnosis not present

## 2021-12-28 NOTE — Progress Notes (Signed)
Chief Complaint  ?Patient presents with  ? Knee Pain  ?  Hx of left knee pain. Appt was made for knee pain, however, patient states she is NOT having any knee pain today. She is starting a new job and they need documentation stating she is able to perform the job duties.   ? ?Mary Holland came in today with really no complaints of left knee pain.  She is anxious to go to work and says that her left knee is not bothering her at this time no swelling and no pain ? ?We examined her left knee.  She walks normally.  She had normal neurovascular function in the left lower extremity her knee function was good with normal range of motion no tenderness no swelling excellent ligament stability and normal strength ? 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

## 2021-12-28 NOTE — Patient Instructions (Signed)
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

## 2022-05-05 IMAGING — XA Imaging study
2 series · 2 of 2 positions shown · non-contrast
Comparison: none

CLINICAL DATA: Lumbosacral spondylosis without myelopathy. Chronic
low back and right leg pain. Three months relief after last epidural
injection, now with recurrent symptoms.

[Series 1: ortho standard · 1 of 1 slices shown (1 of 2)]
[im 1/1]
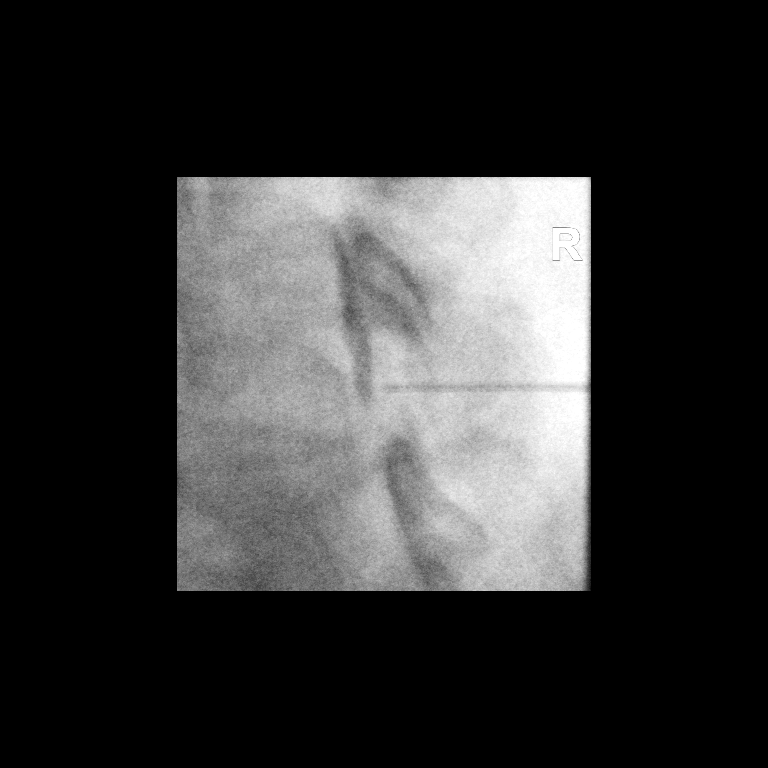

[Series 2: ortho standard · 1 of 1 slices shown (2 of 2)]
[im 1/1]
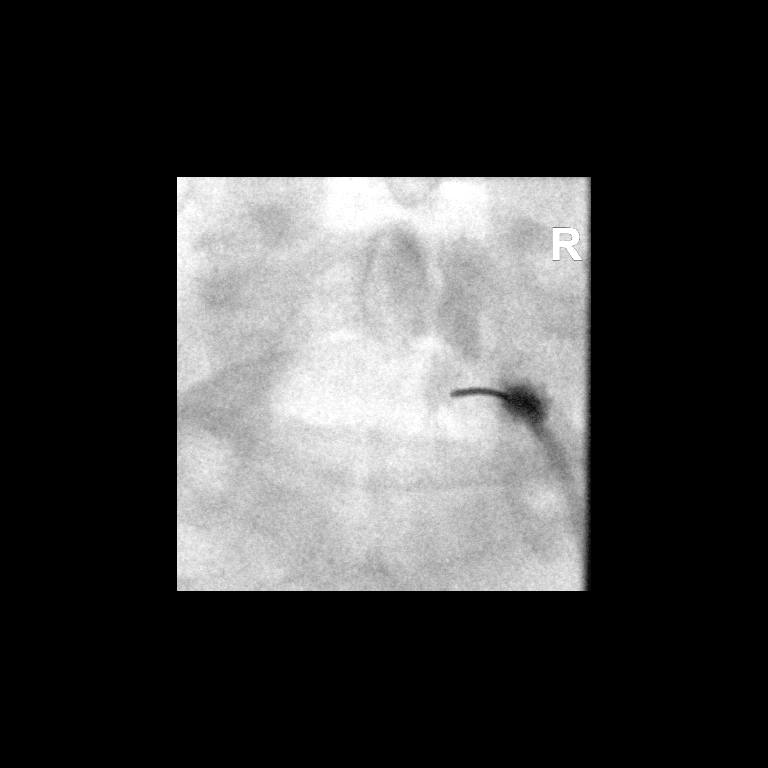

[2 of 2 positions shown; findings below may reference images not displayed]

FLUOROSCOPY TIME:  Radiation Exposure Index (as provided by the
fluoroscopic device): 1.4 mGy

Fluoroscopy Time:  8 seconds

Number of Acquired Images:  0

PROCEDURE:
The procedure, risks, benefits, and alternatives were explained to
the patient. Questions regarding the procedure were encouraged and
answered. The patient understands and consents to the procedure.

LUMBAR EPIDURAL INJECTION:

An interlaminar approach was performed on the right at L5-S1. The
overlying skin was cleansed and anesthetized. A 3.5 inch 20 gauge
epidural needle was advanced using loss-of-resistance technique.

DIAGNOSTIC EPIDURAL INJECTION:

Injection of Isovue-M 200 shows a good epidural pattern with spread
above and below the level of needle placement, primarily on the
right. No vascular opacification is seen.

THERAPEUTIC EPIDURAL INJECTION:

120 mg of Depo-Medrol mixed with 3 mL of 1% lidocaine were
instilled. The procedure was well-tolerated, and the patient was
discharged thirty minutes following the injection in good condition.

COMPLICATIONS:
None immediate.
IMPRESSION: Technically successful interlaminar epidural injection on the right
at L5-S1.

## 2022-08-31 ENCOUNTER — Other Ambulatory Visit: Payer: Self-pay | Admitting: Obstetrics & Gynecology

## 2022-11-25 DIAGNOSIS — B9789 Other viral agents as the cause of diseases classified elsewhere: Secondary | ICD-10-CM | POA: Diagnosis not present

## 2022-11-25 DIAGNOSIS — F17200 Nicotine dependence, unspecified, uncomplicated: Secondary | ICD-10-CM | POA: Insufficient documentation

## 2022-11-25 DIAGNOSIS — J069 Acute upper respiratory infection, unspecified: Secondary | ICD-10-CM | POA: Insufficient documentation

## 2022-11-25 DIAGNOSIS — R0981 Nasal congestion: Secondary | ICD-10-CM | POA: Diagnosis not present

## 2022-11-25 DIAGNOSIS — Z1152 Encounter for screening for COVID-19: Secondary | ICD-10-CM | POA: Diagnosis not present

## 2022-11-25 DIAGNOSIS — R69 Illness, unspecified: Secondary | ICD-10-CM | POA: Diagnosis not present

## 2022-11-26 ENCOUNTER — Encounter (HOSPITAL_COMMUNITY): Payer: Self-pay | Admitting: Emergency Medicine

## 2022-11-26 ENCOUNTER — Emergency Department (HOSPITAL_COMMUNITY)
Admission: EM | Admit: 2022-11-26 | Discharge: 2022-11-26 | Disposition: A | Discharge status: Home / Self Care | Payer: 59 | Attending: Emergency Medicine | Admitting: Emergency Medicine

## 2022-11-26 ENCOUNTER — Other Ambulatory Visit: Payer: Self-pay

## 2022-11-26 ENCOUNTER — Emergency Department (HOSPITAL_COMMUNITY): Payer: 59

## 2022-11-26 DIAGNOSIS — J069 Acute upper respiratory infection, unspecified: Secondary | ICD-10-CM

## 2022-11-26 LAB — RESP PANEL BY RT-PCR (RSV, FLU A&B, COVID)  RVPGX2
Influenza A by PCR: NEGATIVE
Influenza B by PCR: NEGATIVE
Resp Syncytial Virus by PCR: NEGATIVE
SARS Coronavirus 2 by RT PCR: NEGATIVE

## 2022-11-26 MED ORDER — ALBUTEROL SULFATE HFA 108 (90 BASE) MCG/ACT IN AERS
2.0000 | INHALATION_SPRAY | Freq: Once | RESPIRATORY_TRACT | Status: AC
  Administered 2022-11-26: 2 via RESPIRATORY_TRACT
  Filled 2022-11-26: qty 6.7

## 2022-11-26 MED ORDER — FLUTICASONE PROPIONATE 50 MCG/ACT NA SUSP: 2.0000 | Freq: Every day | NASAL | 0 refills | Status: DC

## 2022-11-26 MED ORDER — BENZONATATE 100 MG PO CAPS: 100.0000 mg | ORAL_CAPSULE | Freq: Three times a day (TID) | ORAL | 0 refills | Status: DC | PRN

## 2022-11-26 NOTE — Discharge Instructions (Signed)
He was seen in the emergency room today with cough and congestion.  Your x-ray did not show pneumonia.  I have called in 2 prescriptions to help with your symptoms please likely take time to improve.  You may feel sick over the next week.  If you develop new or suddenly worsening symptoms he should return to the emergency department, otherwise, please follow close with your primary care physician.

## 2022-11-26 NOTE — ED Provider Notes (Signed)
Emergency Department Provider Note   I have reviewed the triage vital signs and the nursing notes.   HISTORY  Chief Complaint URI   HPI Mary Holland is a 52 y.o. female past history of tobacco use presents to the emergency department with cough, congestion with past 3 days.  No fevers or known sick contacts.  Mild sore throat.  Notes nasal congestion and chest soreness with coughing only.  No chest pain at rest or with deep breathing.  No vomiting or diarrhea.  Past Medical History:  Diagnosis Date   Chronic back pain    DDD (degenerative disc disease), cervical    Narcolepsy    Ovarian cyst    Sciatic pain     Review of Systems  Constitutional: No fever/chills ENT: Mild sore throat and congestion.  Cardiovascular: Denies chest pain. Respiratory: Denies shortness of breath. Positive cough.  Gastrointestinal: No abdominal pain.  No nausea, no vomiting.  No diarrhea.  No constipation. Genitourinary: Negative for dysuria. Musculoskeletal: Negative for back pain. Skin: Negative for rash. Neurological: Negative for headaches, focal weakness or numbness.   ____________________________________________   PHYSICAL EXAM:  VITAL SIGNS: ED Triage Vitals  Enc Vitals Group     BP 11/26/22 0027 (!) 163/98     Pulse Rate 11/26/22 0027 84     Resp 11/26/22 0027 17     Temp 11/26/22 0027 98.2 F (36.8 C)     Temp Source 11/26/22 0027 Oral     SpO2 11/26/22 0027 100 %     Weight 11/26/22 0028 175 lb 0.7 oz (79.4 kg)     Height 11/26/22 0028 '5\' 4"'$  (1.626 m)   Constitutional: Alert and oriented. Well appearing and in no acute distress. Eyes: Conjunctivae are normal.  Head: Atraumatic. Nose: Positive congestion/rhinnorhea. Mouth/Throat: Mucous membranes are moist. Neck: No stridor.   Cardiovascular: Normal rate, regular rhythm. Good peripheral circulation. Grossly normal heart sounds.   Respiratory: Normal respiratory effort.  No retractions. Lungs CTAB. No wheezing,  rales, or rhonchi.  Gastrointestinal: No distention.  Musculoskeletal: No lower extremity tenderness nor edema.  Neurologic:  Normal speech and language.  Skin:  Skin is warm, dry and intact. No rash noted. ____________________________________________   LABS (all labs ordered are listed, but only abnormal results are displayed)  Labs Reviewed  RESP PANEL BY RT-PCR (RSV, FLU A&B, COVID)  [id number]   ____________________________________________  RADIOLOGY  DG Chest 2 View  Result Date: 11/26/2022 CLINICAL DATA:  Cough EXAM: CHEST - 2 VIEW COMPARISON:  05/01/2019 FINDINGS: The heart size and mediastinal contours are within normal limits. Both lungs are clear. The visualized skeletal structures are unremarkable. IMPRESSION: No active cardiopulmonary disease. Electronically Signed   By: Ulyses Jarred M.D.   On: 11/26/2022 03:39    ____________________________________________   PROCEDURES  Procedure(s) performed:   Procedures  None ____________________________________________   INITIAL IMPRESSION / ASSESSMENT AND PLAN / ED COURSE  Pertinent labs & imaging results that were available during my care of the patient were reviewed by me and considered in my medical decision making (see chart for details).   This patient is Presenting for Evaluation of cough and congestion, which does require a range of treatment options, and is a complaint that involves a moderate risk of morbidity and mortality.  The Differential Diagnoses include bronchitis, CAP, COVID, Flu, RSV, etc.  Critical Interventions-    Medications  albuterol (VENTOLIN HFA) 108 (90 Base) MCG/ACT inhaler 2 puff (2 puffs Inhalation Given 11/26/22 0302)  Reassessment after intervention: Symptoms improved.     Clinical Laboratory Tests Ordered, included COVID/Flu/RSV negative.   Radiologic Tests Ordered, included CXR. I independently interpreted the images and agree with radiology interpretation.   Social Determinants  of Health Risk patient is a smoker.   Medical Decision Making: Summary:  Patient presents emergency department with cough, congestion over the past 3 days.  Differential as above.  Overall lung exam is reassuring but history of smoking and age will lead to viral panel and chest x-ray along with albuterol MDI here.   Reevaluation with update and discussion with patient. Discussed results and plan for symptom mgmt at home. Work note provided.   Patient's presentation is most consistent with acute presentation with potential threat to life or bodily function.   Disposition: discharge  ____________________________________________  FINAL CLINICAL IMPRESSION(S) / ED DIAGNOSES  Final diagnoses:  Viral upper respiratory tract infection     NEW OUTPATIENT MEDICATIONS STARTED DURING THIS VISIT:  Discharge Medication List as of 11/26/2022  4:07 AM     START taking these medications   Details  benzonatate (TESSALON) 100 MG capsule Take 1 capsule (100 mg total) by mouth 3 (three) times daily as needed for cough., Starting Fri 11/26/2022, Normal    fluticasone (FLONASE) 50 MCG/ACT nasal spray Place 2 sprays into both nostrils daily for 7 days., Starting Fri 11/26/2022, Until Fri 12/03/2022, Normal        Note:  This document was prepared using Dragon voice recognition software and may include unintentional dictation errors.  Nanda Quinton, MD, [facility] Emergency Medicine    Elley Harp, [facility], MD 11/26/22 [phone]

## 2022-11-26 NOTE — ED Triage Notes (Signed)
Pt c/o chest congestion and cough for the past 3 days. Denies any sick exposures.

## 2022-12-02 ENCOUNTER — Encounter: Payer: Self-pay | Admitting: Radiology

## 2023-05-18 ENCOUNTER — Other Ambulatory Visit (HOSPITAL_COMMUNITY)
Admission: RE | Admit: 2023-05-18 | Discharge: 2023-05-18 | Disposition: A | Discharge status: Home / Self Care | Payer: 59 | Source: Ambulatory Visit | Attending: Internal Medicine | Admitting: Internal Medicine

## 2023-05-18 ENCOUNTER — Other Ambulatory Visit (HOSPITAL_COMMUNITY): Payer: Self-pay | Admitting: Gerontology

## 2023-05-18 ENCOUNTER — Ambulatory Visit (HOSPITAL_COMMUNITY)
Admission: RE | Admit: 2023-05-18 | Discharge: 2023-05-18 | Disposition: A | Discharge status: Home / Self Care | Payer: 59 | Source: Ambulatory Visit | Attending: Gerontology | Admitting: Gerontology

## 2023-05-18 DIAGNOSIS — M5430 Sciatica, unspecified side: Secondary | ICD-10-CM | POA: Diagnosis present

## 2023-05-18 DIAGNOSIS — J410 Simple chronic bronchitis: Secondary | ICD-10-CM | POA: Insufficient documentation

## 2023-05-18 DIAGNOSIS — M25562 Pain in left knee: Secondary | ICD-10-CM

## 2023-05-18 LAB — HEPATIC FUNCTION PANEL
ALT: 20 U/L (ref 0–44)
AST: 15 U/L (ref 15–41)
Albumin: 3.9 g/dL (ref 3.5–5.0)
Alkaline Phosphatase: 66 U/L (ref 38–126)
Bilirubin, Direct: <0.1 mg/dL (ref 0.0–0.2)
Total Bilirubin: 0.3 mg/dL (ref 0.3–1.2)
Total Protein: 7.3 g/dL (ref 6.5–8.1)

## 2023-05-18 LAB — CBC WITH DIFFERENTIAL/PLATELET
Abs Immature Granulocytes: 0.05 10*3/uL (ref 0.00–0.07)
Basophils Absolute: 0.1 10*3/uL (ref 0.0–0.1)
Basophils Relative: 1 %
Eosinophils Absolute: 0.2 10*3/uL (ref 0.0–0.5)
Eosinophils Relative: 2 %
HCT: 42.3 % (ref 36.0–46.0)
Hemoglobin: 13.8 g/dL (ref 12.0–15.0)
Immature Granulocytes: 1 %
Lymphocytes Relative: 43 %
Lymphs Abs: 3.9 10*3/uL (ref 0.7–4.0)
MCH: 29.4 pg (ref 26.0–34.0)
MCHC: 32.6 g/dL (ref 30.0–36.0)
MCV: 90 fL (ref 80.0–100.0)
Monocytes Absolute: 0.6 10*3/uL (ref 0.1–1.0)
Monocytes Relative: 6 %
Neutro Abs: 4.3 10*3/uL (ref 1.7–7.7)
Neutrophils Relative %: 47 %
Platelets: 241 10*3/uL (ref 150–400)
RBC: 4.7 MIL/uL (ref 3.87–5.11)
RDW: 14.4 % (ref 11.5–15.5)
WBC: 9.1 10*3/uL (ref 4.0–10.5)
nRBC: 0 % (ref 0.0–0.2)

## 2023-05-18 LAB — LIPID PANEL
Cholesterol: 235 mg/dL — ABNORMAL HIGH (ref 0–200)
HDL: 42 mg/dL (ref >40)
LDL Cholesterol: 145 mg/dL — ABNORMAL HIGH (ref 0–99)
Total CHOL/HDL Ratio: 5.6 RATIO
Triglycerides: 242 mg/dL — ABNORMAL HIGH (ref <150)
VLDL: 48 mg/dL — ABNORMAL HIGH (ref 0–40)

## 2023-05-18 LAB — BASIC METABOLIC PANEL
Anion gap: 8 (ref 5–15)
BUN: 14 mg/dL (ref 6–20)
CO2: 22 mmol/L (ref 22–32)
Calcium: 8.9 mg/dL (ref 8.9–10.3)
Chloride: 105 mmol/L (ref 98–111)
Creatinine, Ser: 0.76 mg/dL (ref 0.44–1.00)
GFR, Estimated: >60 mL/min (ref >60)
Glucose, Bld: 128 mg/dL — ABNORMAL HIGH (ref 70–99)
Potassium: 3.9 mmol/L (ref 3.5–5.1)
Sodium: 135 mmol/L (ref 135–145)

## 2023-05-18 LAB — HEMOGLOBIN A1C
Hgb A1c MFr Bld: 8.4 % — ABNORMAL HIGH (ref 4.8–5.6)
Mean Plasma Glucose: 194.38 mg/dL

## 2023-05-23 ENCOUNTER — Telehealth: Payer: Self-pay | Admitting: Orthopedic Surgery

## 2023-05-23 NOTE — Telephone Encounter (Signed)
Returned the pt's call, lvm for her to call us back.  She is requesting an appointment with Dr. Romeo Apple.

## 2023-05-24 ENCOUNTER — Other Ambulatory Visit (HOSPITAL_COMMUNITY): Payer: Self-pay | Admitting: Internal Medicine

## 2023-05-24 DIAGNOSIS — Z1231 Encounter for screening mammogram for malignant neoplasm of breast: Secondary | ICD-10-CM

## 2023-05-25 ENCOUNTER — Encounter: Payer: Self-pay | Admitting: *Deleted

## 2023-05-30 ENCOUNTER — Ambulatory Visit (HOSPITAL_COMMUNITY): Payer: 59

## 2023-06-08 ENCOUNTER — Ambulatory Visit (HOSPITAL_COMMUNITY)
Admission: RE | Admit: 2023-06-08 | Discharge: 2023-06-08 | Disposition: A | Discharge status: Home / Self Care | Payer: 59 | Source: Ambulatory Visit | Attending: Internal Medicine | Admitting: Internal Medicine

## 2023-06-08 DIAGNOSIS — Z1231 Encounter for screening mammogram for malignant neoplasm of breast: Secondary | ICD-10-CM | POA: Diagnosis present

## 2023-06-16 ENCOUNTER — Encounter: Payer: Self-pay | Admitting: Orthopedic Surgery

## 2023-06-16 ENCOUNTER — Ambulatory Visit: Payer: 59 | Admitting: Orthopedic Surgery

## 2023-06-16 VITALS — BP 144/80 | HR 93 | Ht 64.0 in | Wt 173.0 lb

## 2023-06-16 DIAGNOSIS — M170 Bilateral primary osteoarthritis of knee: Secondary | ICD-10-CM | POA: Diagnosis not present

## 2023-06-16 DIAGNOSIS — M1711 Unilateral primary osteoarthritis, right knee: Secondary | ICD-10-CM

## 2023-06-16 DIAGNOSIS — M1712 Unilateral primary osteoarthritis, left knee: Secondary | ICD-10-CM

## 2023-06-16 MED ORDER — METHYLPREDNISOLONE ACETATE 40 MG/ML IJ SUSP
40.0000 mg | Freq: Once | INTRAMUSCULAR | Status: AC
Start: 2023-06-16 — End: 2023-06-16
  Administered 2023-06-16: 40 mg via INTRA_ARTICULAR

## 2023-06-16 NOTE — Progress Notes (Signed)
Chief Complaint  Patient presents with   Knee Pain    Bil knee pain for 1 month now  would like injections today    Mary Holland has had right knee arthroscopy in the past  She has bilateral knee arthritis by x-ray she is diabetic she is on naproxen takes ibuprofen occasionally  She request bilateral knee injections  Both knees look okay for injection  Procedure note for bilateral knee injections  Procedure note left knee injection verbal consent was obtained to inject left knee joint  Timeout was completed to confirm the site of injection  The medications used were 40 mg depomedrol and 3 cc of 1% lidocaine  Anesthesia was provided by ethyl chloride and the skin was prepped with alcohol.  After cleaning the skin with alcohol a 20-gauge needle was used to inject the left knee joint. There were no complications. A sterile bandage was applied.   Procedure note right knee injection verbal consent was obtained to inject right knee joint  Timeout was completed to confirm the site of injection  The medications used were 40 mg depomedrol and 3 cc of 1% lidocaine  Anesthesia was provided by ethyl chloride and the skin was prepped with alcohol.  After cleaning the skin with alcohol a 20-gauge needle was used to inject the right knee joint. There were no complications. A sterile bandage was applied.    Encounter Diagnoses  Name Primary?   Primary osteoarthritis of right knee Yes   Primary osteoarthritis of left knee     Return as needed

## 2023-06-16 NOTE — Patient Instructions (Signed)
You have received an injection of steroids into the joint. 15% of patients will have increased pain within the 24 hours postinjection.   This is transient and will go away.   We recommend that you use ice packs on the injection site for 20 minutes every 2 hours and extra strength Tylenol 2 tablets every 8 as needed until the pain resolves.  If you continue to have pain after taking the Tylenol and using the ice please call the office for further instructions.   Take naprosyn or ibuprofen for pain

## 2023-11-16 NOTE — Progress Notes (Unsigned)
   There were no vitals taken for this visit.  There is no height or weight on file to calculate BMI.  No chief complaint on file.   No diagnosis found.  DOI/DOS/ Date: NA  {CHL AMB ORT SYMPTOMS POST TREATMENT:21798}

## 2023-11-17 ENCOUNTER — Ambulatory Visit: Payer: 59 | Admitting: Orthopedic Surgery

## 2023-11-17 ENCOUNTER — Encounter: Payer: Self-pay | Admitting: Orthopedic Surgery

## 2023-11-17 DIAGNOSIS — M170 Bilateral primary osteoarthritis of knee: Secondary | ICD-10-CM | POA: Diagnosis not present

## 2023-11-17 DIAGNOSIS — M1711 Unilateral primary osteoarthritis, right knee: Secondary | ICD-10-CM

## 2023-11-17 DIAGNOSIS — M1712 Unilateral primary osteoarthritis, left knee: Secondary | ICD-10-CM

## 2023-11-17 MED ORDER — MELOXICAM 7.5 MG PO TABS: 7.5000 mg | ORAL_TABLET | Freq: Every day | ORAL | 5 refills | Status: DC

## 2023-11-17 MED ORDER — METHYLPREDNISOLONE ACETATE 40 MG/ML IJ SUSP
40.0000 mg | Freq: Once | INTRAMUSCULAR | Status: AC
Start: 2023-11-17 — End: 2023-11-17
  Administered 2023-11-17: 40 mg via INTRA_ARTICULAR

## 2023-11-17 NOTE — Progress Notes (Signed)
Subjective:     Patient ID: Mary Holland, female   DOB: April 05, 1971, 53 y.o.   MRN: [id number]  53 year old female with bilateral knee pain presumably from osteoarthritis without enough disease changes or symptomatic changes to warrant knee replacement presents with bilateral knee pain and some radiation into the right leg and ankle and right thigh.  She was given some tizanidine or cyclobenzaprine to help with muscle spasms.  She has done well with anti-inflammatories and injections although not on anti-inflammatory now previously placed on naproxen  Knee Pain      Review of Systems     Objective:   Physical Exam Vitals and nursing note reviewed.  Constitutional:      Appearance: Normal appearance.  HENT:     Head: Normocephalic and atraumatic.  Eyes:     General: No scleral icterus.       Right eye: No discharge.        Left eye: No discharge.     Extraocular Movements: Extraocular movements intact.     Conjunctiva/sclera: Conjunctivae normal.     Pupils: Pupils are equal, round, and reactive to light.  Cardiovascular:     Rate and Rhythm: Normal rate.     Pulses: Normal pulses.     Comments: NO SWELLING OR VARICOSITIES  Musculoskeletal:     Right knee: No swelling, deformity, effusion, erythema, ecchymosis, lacerations or crepitus. Normal range of motion. No ACL laxity or PCL laxity. Normal alignment and normal meniscus.     Left knee: No swelling, deformity, effusion, erythema, ecchymosis, lacerations or crepitus. Normal range of motion. No ACL laxity or PCL laxity.Normal alignment and normal meniscus.  Skin:    General: Skin is warm and dry.     Capillary Refill: Capillary refill takes less than 2 seconds.     Findings: No bruising, erythema or rash.  Neurological:     General: No focal deficit present.     Mental Status: She is alert and oriented to person, place, and time.     Gait: Gait normal.     Comments: NORMAL SENSATION IN BOTH LOWER EXTREMITIES    Psychiatric:        Mood and Affect: Mood normal.        Behavior: Behavior normal.        Thought Content: Thought content normal.        Judgment: Judgment normal.        Assessment:     Encounter Diagnoses  Name Primary?   Primary osteoarthritis of left knee    Primary osteoarthritis of right knee Yes    53 years old osteoarthritis does not warrant replacement at this time recommend anti-inflammatories and knee injections    Plan:     Meds ordered this encounter  Medications   meloxicam (MOBIC) 7.5 MG tablet    Sig: Take 1 tablet (7.5 mg total) by mouth daily.    Dispense:  30 tablet    Refill:  5   methylPREDNISolone acetate (DEPO-MEDROL) injection 40 mg   methylPREDNISolone acetate (DEPO-MEDROL) injection 40 mg   Procedure note for bilateral knee injections  Procedure note left knee injection verbal consent was obtained to inject left knee joint  Timeout was completed to confirm the site of injection  The medications used were 40 mg depomedrol and 3 cc of 1% lidocaine  Anesthesia was provided by ethyl chloride and the skin was prepped with alcohol.  After cleaning the skin with alcohol a 20-gauge needle was used to inject  the left knee joint. There were no complications. A sterile bandage was applied.   Procedure note right knee injection verbal consent was obtained to inject right knee joint  Timeout was completed to confirm the site of injection  The medications used were 40 mg depomedrol and 3 cc of 1% lidocaine  Anesthesia was provided by ethyl chloride and the skin was prepped with alcohol.  After cleaning the skin with alcohol a 20-gauge needle was used to inject the right knee joint. There were no complications. A sterile bandage was applied.

## 2023-11-28 ENCOUNTER — Encounter (INDEPENDENT_AMBULATORY_CARE_PROVIDER_SITE_OTHER): Payer: Self-pay | Admitting: *Deleted

## 2024-02-16 ENCOUNTER — Other Ambulatory Visit (HOSPITAL_COMMUNITY)
Admission: RE | Admit: 2024-02-16 | Discharge: 2024-02-16 | Disposition: A | Discharge status: Home / Self Care | Source: Ambulatory Visit | Attending: Internal Medicine | Admitting: Internal Medicine

## 2024-02-16 DIAGNOSIS — I10 Essential (primary) hypertension: Secondary | ICD-10-CM | POA: Insufficient documentation

## 2024-02-16 LAB — CBC WITH DIFFERENTIAL/PLATELET
Abs Immature Granulocytes: 0.05 10*3/uL (ref 0.00–0.07)
Basophils Absolute: 0.1 10*3/uL (ref 0.0–0.1)
Basophils Relative: 1 %
Eosinophils Absolute: 0.2 10*3/uL (ref 0.0–0.5)
Eosinophils Relative: 2 %
HCT: 40.7 % (ref 36.0–46.0)
Hemoglobin: 13.8 g/dL (ref 12.0–15.0)
Immature Granulocytes: 1 %
Lymphocytes Relative: 41 %
Lymphs Abs: 4.5 10*3/uL — ABNORMAL HIGH (ref 0.7–4.0)
MCH: 29.6 pg (ref 26.0–34.0)
MCHC: 33.9 g/dL (ref 30.0–36.0)
MCV: 87.3 fL (ref 80.0–100.0)
Monocytes Absolute: 0.6 10*3/uL (ref 0.1–1.0)
Monocytes Relative: 6 %
Neutro Abs: 5.5 10*3/uL (ref 1.7–7.7)
Neutrophils Relative %: 49 %
Platelets: 250 10*3/uL (ref 150–400)
RBC: 4.66 MIL/uL (ref 3.87–5.11)
RDW: 14 % (ref 11.5–15.5)
WBC: 10.8 10*3/uL — ABNORMAL HIGH (ref 4.0–10.5)
nRBC: 0 % (ref 0.0–0.2)

## 2024-02-16 LAB — BASIC METABOLIC PANEL WITH GFR
Anion gap: 9 (ref 5–15)
BUN: 17 mg/dL (ref 6–20)
CO2: 25 mmol/L (ref 22–32)
Calcium: 9.4 mg/dL (ref 8.9–10.3)
Chloride: 101 mmol/L (ref 98–111)
Creatinine, Ser: 0.78 mg/dL (ref 0.44–1.00)
GFR, Estimated: >60 mL/min (ref >60)
Glucose, Bld: 118 mg/dL — ABNORMAL HIGH (ref 70–99)
Potassium: 4 mmol/L (ref 3.5–5.1)
Sodium: 135 mmol/L (ref 135–145)

## 2024-02-16 LAB — LIPID PANEL
Cholesterol: 213 mg/dL — ABNORMAL HIGH (ref 0–200)
HDL: 48 mg/dL (ref >40)
LDL Cholesterol: 139 mg/dL — ABNORMAL HIGH (ref 0–99)
Total CHOL/HDL Ratio: 4.4 ratio
Triglycerides: 132 mg/dL (ref <150)
VLDL: 26 mg/dL (ref 0–40)

## 2024-02-16 LAB — HEPATIC FUNCTION PANEL
ALT: 20 U/L (ref 0–44)
AST: 15 U/L (ref 15–41)
Albumin: 4.2 g/dL (ref 3.5–5.0)
Alkaline Phosphatase: 64 U/L (ref 38–126)
Bilirubin, Direct: 0.1 mg/dL (ref 0.0–0.2)
Indirect Bilirubin: 0.7 mg/dL (ref 0.3–0.9)
Total Bilirubin: 0.8 mg/dL (ref 0.0–1.2)
Total Protein: 7.9 g/dL (ref 6.5–8.1)

## 2024-02-16 LAB — HEMOGLOBIN A1C
Hgb A1c MFr Bld: 7.6 % — ABNORMAL HIGH (ref 4.8–5.6)
Mean Plasma Glucose: 171.42 mg/dL

## 2024-02-17 ENCOUNTER — Encounter (INDEPENDENT_AMBULATORY_CARE_PROVIDER_SITE_OTHER): Payer: Self-pay | Admitting: *Deleted

## 2024-02-17 LAB — MICROALBUMIN / CREATININE URINE RATIO
Creatinine, Urine: 214.1 mg/dL
Microalb Creat Ratio: 4 mg/g{creat} (ref 0–29)
Microalb, Ur: 8.8 ug/mL — ABNORMAL HIGH

## 2024-03-08 ENCOUNTER — Telehealth: Payer: Self-pay

## 2024-03-08 DIAGNOSIS — Z1211 Encounter for screening for malignant neoplasm of colon: Secondary | ICD-10-CM

## 2024-03-08 NOTE — Telephone Encounter (Signed)
 Who is your primary care physician: DR.TESFAYE FANTA  Reasons for the colonoscopy: SCREENING  Have you had a colonoscopy before?  NO  Do you have family history of colon cancer? NO  Previous colonoscopy with polyps removed? NO  Do you have a history colorectal cancer?   NO  Are you diabetic? If yes, Type 1 or Type 2?    YES TYPE 1  Do you have a prosthetic or mechanical heart valve? NO  Do you have a pacemaker/defibrillator?   NO  Have you had endocarditis/atrial fibrillation? NO  Have you had joint replacement within the last 12 months?  NO  Do you tend to be constipated or have to use laxatives? NO  Do you have any history of drugs or alchohol?  NO  Do you use supplemental oxygen ?  NO  Have you had a stroke or heart attack within the last 6 months? NO  Do you take weight loss medication?  NO  For female patients: have you had a hysterectomy?  YES                                     are you post menopausal?       NO                                            do you still have your menstrual cycle? NO  LAST 08/19/2006  Do you take any blood-thinning medications such as: (aspirin, warfarin, Plavix, Aggrenox)  NO  If yes we need the name, milligram, dosage and who is prescribing doctor  Current Outpatient Medications on File Prior to Visit  Medication Sig Dispense Refill   atorvastatin (LIPITOR) 20 MG tablet Take 20 mg by mouth daily.     benzonatate  (TESSALON ) 100 MG capsule Take 1 capsule (100 mg total) by mouth 3 (three) times daily as needed for cough. 21 capsule 0   cyclobenzaprine  (FLEXERIL ) 10 MG tablet Take 10 mg by mouth every 8 (eight) hours as needed.     estradiol  (ESTRACE ) 2 MG tablet TAKE ONE TABLET BY MOUTH ONCE DAILY. 30 tablet 11   estradiol  (ESTRACE ) 2 MG tablet Take 1 tablet (2 mg total) by mouth daily. 30 tablet 11   estradiol  (ESTRACE ) 2 MG tablet TAKE 1 TABLET BY MOUTH ONCE DAILY. 30 tablet 11   fluticasone  (FLONASE ) 50 MCG/ACT nasal spray Place 2  sprays into both nostrils daily for 7 days. 1 g 0   ibuprofen  (ADVIL ) 800 MG tablet Take 800 mg by mouth 3 (three) times daily.     losartan-hydrochlorothiazide (HYZAAR) 50-12.5 MG tablet Take 1 tablet by mouth daily.     meloxicam  (MOBIC ) 7.5 MG tablet Take 1 tablet (7.5 mg total) by mouth daily. 30 tablet 5   metFORMIN (GLUCOPHAGE) 500 MG tablet Take 500 mg by mouth 2 (two) times daily.     naproxen  (NAPROSYN ) 500 MG tablet Take 1 tablet (500 mg total) by mouth 2 (two) times daily. 30 tablet 0   No current facility-administered medications on file prior to visit.    No Known Allergies   [facility]: [facility]  Primary Insurance Name: [employer] [place] [id number]  Best number where you can be reached: [phone]

## 2024-05-08 NOTE — Telephone Encounter (Signed)
 ASA 2. Needs BMP prior as on diuretic.

## 2024-05-09 MED ORDER — PEG 3350-KCL-NA BICARB-NACL 420 G PO SOLR: 4000.0000 mL | Freq: Once | ORAL | 0 refills | Status: AC

## 2024-05-09 NOTE — Addendum Note (Signed)
 Addended by: JEANELL GRAEME RAMAN on: 05/09/2024 08:14 AM   Modules accepted: Orders

## 2024-05-09 NOTE — Telephone Encounter (Addendum)
 Spoke with pt. Scheduled with Dr. Shaaron 8/28. Aware will send rx to pharmacy and instructions to her.    PA approved via [facility] CPT Code GECOL Description: Colonoscopy Authorization Number: [id number] Case Number: [id number] Review Date: 05/09/2024 8:12:54 AM Expiration Date: 08/07/2024 Status: Your case has been Approved.

## 2024-05-10 ENCOUNTER — Encounter (INDEPENDENT_AMBULATORY_CARE_PROVIDER_SITE_OTHER): Payer: Self-pay | Admitting: *Deleted

## 2024-05-10 NOTE — Telephone Encounter (Signed)
 Referral completed, [facility] apt letter sent to PCP

## 2024-05-14 ENCOUNTER — Other Ambulatory Visit (HOSPITAL_COMMUNITY)
Admission: RE | Admit: 2024-05-14 | Discharge: 2024-05-14 | Disposition: A | Discharge status: Home / Self Care | Source: Ambulatory Visit | Attending: Internal Medicine | Admitting: Internal Medicine

## 2024-05-14 DIAGNOSIS — I10 Essential (primary) hypertension: Secondary | ICD-10-CM | POA: Diagnosis present

## 2024-05-14 DIAGNOSIS — E782 Mixed hyperlipidemia: Secondary | ICD-10-CM | POA: Diagnosis not present

## 2024-05-14 LAB — LIPID PANEL
Cholesterol: 226 mg/dL — ABNORMAL HIGH (ref 0–200)
HDL: 51 mg/dL (ref >40)
LDL Cholesterol: 148 mg/dL — ABNORMAL HIGH (ref 0–99)
Total CHOL/HDL Ratio: 4.4 ratio
Triglycerides: 136 mg/dL (ref <150)
VLDL: 27 mg/dL (ref 0–40)

## 2024-05-15 LAB — HEMOGLOBIN A1C
Hgb A1c MFr Bld: 8.8 % — ABNORMAL HIGH (ref 4.8–5.6)
Mean Plasma Glucose: 206 mg/dL

## 2024-05-22 ENCOUNTER — Other Ambulatory Visit (HOSPITAL_COMMUNITY)
Admission: RE | Admit: 2024-05-22 | Discharge: 2024-05-22 | Disposition: A | Discharge status: Home / Self Care | Source: Ambulatory Visit | Attending: Internal Medicine | Admitting: Internal Medicine

## 2024-05-22 DIAGNOSIS — Z1211 Encounter for screening for malignant neoplasm of colon: Secondary | ICD-10-CM | POA: Insufficient documentation

## 2024-05-22 LAB — BASIC METABOLIC PANEL WITH GFR
Anion gap: 9 (ref 5–15)
BUN: 18 mg/dL (ref 6–20)
CO2: 25 mmol/L (ref 22–32)
Calcium: 9.2 mg/dL (ref 8.9–10.3)
Chloride: 102 mmol/L (ref 98–111)
Creatinine, Ser: 0.72 mg/dL (ref 0.44–1.00)
GFR, Estimated: >60 mL/min (ref >60)
Glucose, Bld: 170 mg/dL — ABNORMAL HIGH (ref 70–99)
Potassium: 4.2 mmol/L (ref 3.5–5.1)
Sodium: 136 mmol/L (ref 135–145)

## 2024-05-31 ENCOUNTER — Ambulatory Visit (HOSPITAL_COMMUNITY)
Admission: RE | Admit: 2024-05-31 | Discharge: 2024-05-31 | Disposition: A | Discharge status: Home / Self Care | Attending: Internal Medicine | Admitting: Internal Medicine

## 2024-05-31 ENCOUNTER — Encounter (HOSPITAL_COMMUNITY)
Admission: RE | Disposition: A | Discharge status: Home / Self Care | Payer: Self-pay | Source: Home / Self Care | Attending: Internal Medicine

## 2024-05-31 ENCOUNTER — Other Ambulatory Visit: Payer: Self-pay

## 2024-05-31 ENCOUNTER — Ambulatory Visit (HOSPITAL_BASED_OUTPATIENT_CLINIC_OR_DEPARTMENT_OTHER): Admitting: Anesthesiology

## 2024-05-31 ENCOUNTER — Ambulatory Visit (HOSPITAL_COMMUNITY): Admitting: Anesthesiology

## 2024-05-31 ENCOUNTER — Encounter (HOSPITAL_COMMUNITY): Payer: Self-pay | Admitting: Internal Medicine

## 2024-05-31 DIAGNOSIS — F17210 Nicotine dependence, cigarettes, uncomplicated: Secondary | ICD-10-CM | POA: Diagnosis not present

## 2024-05-31 DIAGNOSIS — D123 Benign neoplasm of transverse colon: Secondary | ICD-10-CM | POA: Diagnosis not present

## 2024-05-31 DIAGNOSIS — I10 Essential (primary) hypertension: Secondary | ICD-10-CM | POA: Diagnosis not present

## 2024-05-31 DIAGNOSIS — D175 Benign lipomatous neoplasm of intra-abdominal organs: Secondary | ICD-10-CM | POA: Diagnosis not present

## 2024-05-31 DIAGNOSIS — K635 Polyp of colon: Secondary | ICD-10-CM | POA: Insufficient documentation

## 2024-05-31 DIAGNOSIS — Z1211 Encounter for screening for malignant neoplasm of colon: Secondary | ICD-10-CM | POA: Insufficient documentation

## 2024-05-31 LAB — GLUCOSE, CAPILLARY: Glucose-Capillary: 111 mg/dL — ABNORMAL HIGH (ref 70–99)

## 2024-05-31 SURGERY — COLONOSCOPY
Anesthesia: General

## 2024-05-31 MED ORDER — LIDOCAINE 2% (20 MG/ML) 5 ML SYRINGE
INTRAMUSCULAR | Status: DC | PRN
  Administered 2024-05-31: 100 mg via INTRAVENOUS

## 2024-05-31 MED ORDER — PROPOFOL 10 MG/ML IV BOLUS
INTRAVENOUS | Status: DC | PRN
  Administered 2024-05-31: 200 ug/kg/min via INTRAVENOUS
  Administered 2024-05-31 (×2): 100 mg via INTRAVENOUS

## 2024-05-31 MED ORDER — GLYCOPYRROLATE PF 0.2 MG/ML IJ SOSY
PREFILLED_SYRINGE | INTRAMUSCULAR | Status: DC | PRN
  Administered 2024-05-31: .2 mg via INTRAVENOUS

## 2024-05-31 MED ORDER — STERILE WATER FOR IRRIGATION IR SOLN
Status: DC | PRN
  Administered 2024-05-31: 260 mL

## 2024-05-31 MED ORDER — LACTATED RINGERS IV SOLN: INTRAVENOUS | Status: DC

## 2024-05-31 NOTE — H&P (Signed)
 @LOGO @   Primary Care Physician:  Fanta, Tesfaye Demissie, MD Primary Gastroenterologist:  Dr. Dillard  Pre-Procedure History & Physical: HPI:  Mary Holland is a 53 y.o. female here for  first ever average risk [place] colonoscopy.  Past Medical History:  Diagnosis Date   Chronic back pain    DDD (degenerative disc disease), cervical    Narcolepsy    Ovarian cyst    Sciatic pain     Past Surgical History:  Procedure Laterality Date   ABDOMINAL HYSTERECTOMY     CESAREAN SECTION     KNEE SURGERY Right    arthroscopy   partial hysterectomy     removal of left ovary     removal rt ovary      Prior to Admission medications   Medication Sig Start Date End Date Taking? Authorizing Provider  atorvastatin (LIPITOR) 20 MG tablet Take 20 mg by mouth daily. 05/24/23  Yes [provider]  cyclobenzaprine  (FLEXERIL ) 10 MG tablet Take 10 mg by mouth every 8 (eight) hours as needed. 11/15/23  Yes [provider]  estradiol  (ESTRACE ) 2 MG tablet TAKE ONE TABLET BY MOUTH ONCE DAILY. 08/25/21  Yes Jayne Vonn DEL, MD  estradiol  (ESTRACE ) 2 MG tablet TAKE 1 TABLET BY MOUTH ONCE DAILY. 09/07/22  Yes Jayne Vonn DEL, MD  losartan-hydrochlorothiazide (HYZAAR) 50-12.5 MG tablet Take 1 tablet by mouth daily. 05/24/23  Yes [provider]  meloxicam  (MOBIC ) 7.5 MG tablet Take 1 tablet (7.5 mg total) by mouth daily. 11/17/23  Yes Margrette Taft BRAVO, MD  metFORMIN (GLUCOPHAGE) 500 MG tablet Take 500 mg by mouth 2 (two) times daily. 05/24/23   [provider]    Allergies as of 05/09/2024   (No Known Allergies)    History reviewed. No pertinent family history.  Social History   Socioeconomic History   Marital status: Divorced    Spouse name: Not on file   Number of children: Not on file   Years of education: Not on file   Highest education level: Not on file  Occupational History   Not on file  Tobacco Use   Smoking status: Every Day    Current packs/day:  1.00    Average packs/day: 1 pack/day for 10.0 years (10.0 ttl pk-yrs)    Types: Cigarettes   Smokeless tobacco: Never  Substance and Sexual Activity   Alcohol use: Not Currently    Comment: occassionally    Drug use: No   Sexual activity: Yes    Birth control/protection: Surgical  Other Topics Concern   Not on file  Social History Narrative   Not on file   Social Drivers of Health   Financial Resource Strain: Not on file  Food Insecurity: Not on file  Transportation Needs: Not on file  Physical Activity: Not on file  Stress: Not on file  Social Connections: Not on file  Intimate Partner Violence: Not on file    Review of Systems: See HPI, otherwise negative ROS  Physical Exam: BP (!) 164/99   Pulse 73   Temp 97.9 F (36.6 C) (Oral)   Resp 17   Ht 5' 4 (1.626 m)   Wt 78 kg   SpO2 99%   BMI 29.52 kg/m  General:   Alert,  Well-developed, well-nourished, pleasant and cooperative in NAD Lungs:  Clear throughout to auscultation.   No wheezes, crackles, or rhonchi. No acute distress. Heart:  Regular rate and rhythm; no murmurs, clicks, rubs,  or gallops. Abdomen: Non-distended, normal bowel  sounds.  Soft and nontender without appreciable mass or hepatosplenomegaly.   Impression/Plan:    53 year old lady here for first-ever average risk screening colonoscopy.  The risks, benefits, limitations, alternatives and imponderables have been reviewed with the patient. Questions have been answered. All parties are agreeable.       Notice: This dictation was prepared with Dragon dictation along with smaller phrase technology. Any transcriptional errors that result from this process are unintentional and may not be corrected upon review.

## 2024-05-31 NOTE — Discharge Instructions (Addendum)
  Colonoscopy Discharge Instructions  Read the instructions outlined below and refer to this sheet in the next few weeks. These discharge instructions provide you with general information on caring for yourself after you leave the hospital. Your doctor may also give you specific instructions. While your treatment has been planned according to the most current medical practices available, unavoidable complications occasionally occur. If you have any problems or questions after discharge, call Dr. Shaaron at [phone]. ACTIVITY You may resume your regular activity, but move at a slower pace for the next 24 hours.  Take frequent rest periods for the next 24 hours.  Walking will help get rid of the air and reduce the bloated feeling in your belly (abdomen).  No driving for 24 hours (because of the medicine (anesthesia) used during the test).   Do not sign any important legal documents or operate any machinery for 24 hours (because of the anesthesia used during the test).  NUTRITION Drink plenty of fluids.  You may resume your normal diet as instructed by your doctor.  Begin with a light meal and progress to your normal diet. Heavy or fried foods are harder to digest and may make you feel sick to your stomach (nauseated).  Avoid alcoholic beverages for 24 hours or as instructed.  MEDICATIONS You may resume your normal medications unless your doctor tells you otherwise.  WHAT YOU CAN EXPECT TODAY Some feelings of bloating in the abdomen.  Passage of more gas than usual.  Spotting of blood in your stool or on the toilet paper.  IF YOU HAD POLYPS REMOVED DURING THE COLONOSCOPY: No aspirin products for 7 days or as instructed.  No alcohol for 7 days or as instructed.  Eat a soft diet for the next 24 hours.  FINDING OUT THE RESULTS OF YOUR TEST Not all test results are available during your visit. If your test results are not back during the visit, make an appointment with your caregiver to find out the  results. Do not assume everything is normal if you have not heard from your caregiver or the medical facility. It is important for you to follow up on all of your test results.  SEEK IMMEDIATE MEDICAL ATTENTION IF: You have more than a spotting of blood in your stool.  Your belly is swollen (abdominal distention).  You are nauseated or vomiting.  You have a temperature over 101.  You have abdominal pain or discomfort that is severe or gets worse throughout the day.    3 polyps found and removed today   further recommendations to follow pending review of pathology report

## 2024-05-31 NOTE — Op Note (Signed)
 [facility] Patient Name: Mary Holland Procedure Date: 05/31/2024 10:06 AM MRN: [id number] Date of Birth: October 18, 1970 Attending MD: Lamar Ozell Hollingshead , MD, [id number] CSN: [id number] Age: 53 Admit Type: Outpatient Procedure:                Colonoscopy Indications:              Screening for colorectal malignant neoplasm Providers:                Lamar Ozell Hollingshead, MD, Madelin Hunter, RN, [place]                            Wilson, Technician Referring MD:              Medicines:                Propofol  per Anesthesia Complications:            No immediate complications. Estimated Blood Loss:     Estimated blood loss was minimal. Procedure:                Pre-Anesthesia Assessment:                           - Prior to the procedure, a History and Physical                            was performed, and patient medications and                            allergies were reviewed. The patient's tolerance of                            previous anesthesia was also reviewed. The risks                            and benefits of the procedure and the sedation                            options and risks were discussed with the patient.                            All questions were answered, and informed consent                            was obtained. Prior Anticoagulants: The patient has                            taken no anticoagulant or antiplatelet agents. ASA                            Grade Assessment: III - A patient with severe                            systemic disease. After reviewing the risks and  benefits, the patient was deemed in satisfactory                            condition to undergo the procedure.                           After obtaining informed consent, the colonoscope                            was passed under direct vision. Throughout the                            procedure, the patient's blood pressure, pulse, and                             oxygen  saturations were monitored continuously. The                            [id number] ([id number]) Colon was introduced through                            the anus and advanced to the the cecum, identified                            by appendiceal orifice and ileocecal valve. The                            colonoscopy was performed without difficulty. The                            patient tolerated the procedure well. The quality                            of the bowel preparation was adequate. The                            ileocecal valve, appendiceal orifice, and rectum                            were photographed. The colonoscopy was performed                            without difficulty. The entire colon was well                            visualized. Scope In: Scope Out: 10:41:17 AM Scope Withdrawal Time: 0 hours 11 minutes 16 seconds  Findings:      The perianal and digital rectal examinations were normal. 1 cm pale       yellowish submucosal nodule ascending segment positive pillow sign.      Three sessile polyps were found in the recto-sigmoid colon and splenic       flexure. The polyps were 3 to 5 mm in size. These polyps were removed       with a cold snare. Resection and  retrieval were complete. Estimated       blood loss was minimal.      The exam was otherwise without abnormality on direct and retroflexion       views. Impression:               - Three 3 to 5 mm polyps at the recto-sigmoid colon                            and at the splenic flexure, removed with a cold                            snare. Resected and retrieved. Ascending colon                            lipoma.                           - The examination was otherwise normal on direct                            and retroflexion views. Moderate Sedation:      Moderate (conscious) sedation was personally administered by an       anesthesia professional. The following parameters were monitored:  oxygen        saturation, heart rate, blood pressure, respiratory rate, EKG, adequacy       of pulmonary ventilation, and response to care. Recommendation:           - Patient has a contact number available for                            emergencies. The signs and symptoms of potential                            delayed complications were discussed with the                            patient. Return to normal activities tomorrow.                            Written discharge instructions were provided to the                            patient.                           - Advance diet as tolerated.                           - Continue present medications.                           - Repeat colonoscopy date to be determined after                            pending pathology results are reviewed for  surveillance.                           - Return to GI office (date not yet determined). Procedure Code(s):        --- Professional ---                           [phone], Colonoscopy, flexible; with removal of                            tumor(s), polyp(s), or other lesion(s) by snare                            technique Diagnosis Code(s):        --- Professional ---                           Z12.11, Encounter for screening for malignant                            neoplasm of colon                           D12.7, Benign neoplasm of rectosigmoid junction                           D12.3, Benign neoplasm of transverse colon (hepatic                            flexure or splenic flexure) CPT copyright 2022 American Medical Association. All rights reserved. The codes documented in this report are preliminary and upon coder review may  be revised to meet current compliance requirements. [name]. Caryl Manas, MD Lamar Ozell Hollingshead, MD 05/31/2024 10:45:56 AM This report has been signed electronically. Number of Addenda: 0

## 2024-05-31 NOTE — Transfer of Care (Signed)
 Immediate Anesthesia Transfer of Care Note  Patient: Mary Holland  Procedure(s) Performed: COLONOSCOPY  Patient Location: Endoscopy Unit  Anesthesia Type:General  Level of Consciousness: awake, alert , and oriented  Airway & Oxygen  Therapy: Patient Spontanous Breathing  Post-op Assessment: Report given to RN and Post -op Vital signs reviewed and stable  Post vital signs: Reviewed and stable  Last Vitals:  Vitals Value Taken Time  BP    Temp    Pulse    Resp    SpO2      Last Pain:  Vitals:   05/31/24 1021  TempSrc:   PainSc: 0-No pain      Patients Stated Pain Goal: 7 (05/31/24 0834)  Complications: No notable events documented.

## 2024-05-31 NOTE — Anesthesia Preprocedure Evaluation (Signed)
 Anesthesia Evaluation  Patient identified by MRN, date of birth, ID band Patient awake    Reviewed: Allergy & Precautions, H&P , NPO status , Patient's Chart, lab work & pertinent test results, reviewed documented beta blocker date and time   Airway Mallampati: II  TM Distance: >3 FB Neck ROM: full    Dental no notable dental hx.    Pulmonary neg pulmonary ROS, Current Smoker   Pulmonary exam normal breath sounds clear to auscultation       Cardiovascular Exercise Tolerance: Good hypertension, negative cardio ROS  Rhythm:regular Rate:Normal     Neuro/Psych  Neuromuscular disease  negative psych ROS   GI/Hepatic negative GI ROS, Neg liver ROS,,,  Endo/Other  negative endocrine ROS    Renal/GU negative Renal ROS  negative genitourinary   Musculoskeletal   Abdominal   Peds  Hematology negative hematology ROS (+)   Anesthesia Other Findings   Reproductive/Obstetrics negative OB ROS                              Anesthesia Physical Anesthesia Plan  ASA: 2  Anesthesia Plan: General   Post-op Pain Management:    Induction:   PONV Risk Score and Plan: Propofol  infusion  Airway Management Planned:   Additional Equipment:   Intra-op Plan:   Post-operative Plan:   Informed Consent: I have reviewed the patients History and Physical, chart, labs and discussed the procedure including the risks, benefits and alternatives for the proposed anesthesia with the patient or authorized representative who has indicated his/her understanding and acceptance.     Dental Advisory Given  Plan Discussed with: CRNA  Anesthesia Plan Comments:         Anesthesia Quick Evaluation

## 2024-06-01 ENCOUNTER — Ambulatory Visit: Payer: Self-pay | Admitting: Internal Medicine

## 2024-06-01 ENCOUNTER — Encounter (HOSPITAL_COMMUNITY): Payer: Self-pay | Admitting: Internal Medicine

## 2024-06-01 LAB — SURGICAL PATHOLOGY

## 2024-06-02 NOTE — Anesthesia Postprocedure Evaluation (Signed)
 Anesthesia Post Note  Patient: Mary Holland  Procedure(s) Performed: COLONOSCOPY  Patient location during evaluation: Phase II Anesthesia Type: General Level of consciousness: awake Pain management: pain level controlled Vital Signs Assessment: post-procedure vital signs reviewed and stable Respiratory status: spontaneous breathing and respiratory function stable Cardiovascular status: blood pressure returned to baseline and stable Postop Assessment: no headache and no apparent nausea or vomiting Anesthetic complications: no Comments: Late entry   No notable events documented.   Last Vitals:  Vitals:   05/31/24 1043 05/31/24 1047  BP: 105/70 112/69  Pulse: 93 (!) 103  Resp: 16   Temp: 36.7 C   SpO2: 100% 100%    Last Pain:  Vitals:   05/31/24 1047  TempSrc:   PainSc: 0-No pain                 Yvonna JINNY Bosworth

## 2024-08-01 ENCOUNTER — Other Ambulatory Visit (HOSPITAL_COMMUNITY)
Admission: RE | Admit: 2024-08-01 | Discharge: 2024-08-01 | Disposition: A | Discharge status: Home / Self Care | Source: Ambulatory Visit | Attending: Internal Medicine | Admitting: Internal Medicine

## 2024-08-01 DIAGNOSIS — I10 Essential (primary) hypertension: Secondary | ICD-10-CM | POA: Diagnosis present

## 2024-08-01 LAB — BASIC METABOLIC PANEL WITH GFR
Anion gap: 12 (ref 5–15)
BUN: 12 mg/dL (ref 6–20)
CO2: 24 mmol/L (ref 22–32)
Calcium: 9.1 mg/dL (ref 8.9–10.3)
Chloride: 102 mmol/L (ref 98–111)
Creatinine, Ser: 0.72 mg/dL (ref 0.44–1.00)
GFR, Estimated: >60 mL/min (ref >60)
Glucose, Bld: 163 mg/dL — ABNORMAL HIGH (ref 70–99)
Potassium: 3.7 mmol/L (ref 3.5–5.1)
Sodium: 137 mmol/L (ref 135–145)

## 2024-08-01 LAB — HEMOGLOBIN A1C
Hgb A1c MFr Bld: 7.9 % — ABNORMAL HIGH (ref 4.8–5.6)
Mean Plasma Glucose: 180.03 mg/dL

## 2024-09-06 ENCOUNTER — Other Ambulatory Visit: Payer: Self-pay | Admitting: Orthopedic Surgery

## 2024-09-06 DIAGNOSIS — M1711 Unilateral primary osteoarthritis, right knee: Secondary | ICD-10-CM

## 2024-09-06 DIAGNOSIS — M1712 Unilateral primary osteoarthritis, left knee: Secondary | ICD-10-CM
